# Patient Record
Sex: Male | Born: 1945 | Race: White | Hispanic: No | Marital: Married | Smoking: Never smoker
Health system: Southern US, Community
[De-identification: ages and names within clinical notes are randomized; demographics above are authoritative.]

## PROBLEM LIST (undated history)

## (undated) DIAGNOSIS — N320 Bladder-neck obstruction: Secondary | ICD-10-CM

## (undated) DIAGNOSIS — I10 Essential (primary) hypertension: Secondary | ICD-10-CM

## (undated) DIAGNOSIS — R350 Frequency of micturition: Secondary | ICD-10-CM

## (undated) DIAGNOSIS — N433 Hydrocele, unspecified: Secondary | ICD-10-CM

## (undated) DIAGNOSIS — E785 Hyperlipidemia, unspecified: Secondary | ICD-10-CM

## (undated) DIAGNOSIS — N529 Male erectile dysfunction, unspecified: Secondary | ICD-10-CM

## (undated) HISTORY — PX: TONSILLECTOMY AND ADENOIDECTOMY: SUR1326

## (undated) HISTORY — DX: Frequency of micturition: R35.0

## (undated) HISTORY — DX: Essential (primary) hypertension: I10

## (undated) HISTORY — DX: Hydrocele, unspecified: N43.3

## (undated) HISTORY — PX: VASECTOMY: SHX75

## (undated) HISTORY — DX: Hyperlipidemia, unspecified: E78.5

## (undated) HISTORY — DX: Male erectile dysfunction, unspecified: N52.9

## (undated) HISTORY — DX: Bladder-neck obstruction: N32.0

---

## 2013-02-06 ENCOUNTER — Ambulatory Visit: Payer: Self-pay | Admitting: Unknown Physician Specialty

## 2013-02-10 LAB — PATHOLOGY REPORT

## 2014-12-07 DIAGNOSIS — E782 Mixed hyperlipidemia: Secondary | ICD-10-CM | POA: Insufficient documentation

## 2015-04-22 ENCOUNTER — Encounter: Payer: Self-pay | Admitting: Urology

## 2015-05-12 ENCOUNTER — Ambulatory Visit (INDEPENDENT_AMBULATORY_CARE_PROVIDER_SITE_OTHER): Payer: Medicare HMO | Admitting: Urology

## 2015-05-12 ENCOUNTER — Encounter: Payer: Self-pay | Admitting: Urology

## 2015-05-12 VITALS — BP 138/75 | HR 71 | Ht 71.0 in | Wt 196.0 lb

## 2015-05-12 DIAGNOSIS — R3915 Urgency of urination: Secondary | ICD-10-CM | POA: Diagnosis not present

## 2015-05-12 DIAGNOSIS — N401 Enlarged prostate with lower urinary tract symptoms: Secondary | ICD-10-CM

## 2015-05-12 LAB — MICROSCOPIC EXAMINATION: Bacteria, UA: NONE SEEN

## 2015-05-12 LAB — BLADDER SCAN AMB NON-IMAGING: SCAN RESULT: 0

## 2015-05-12 LAB — PR COMPLEX UROFLOWMETRY: SCAN RESULT: 310

## 2015-05-12 LAB — URINALYSIS, COMPLETE
Bilirubin, UA: NEGATIVE
Glucose, UA: NEGATIVE
Ketones, UA: NEGATIVE
LEUKOCYTES UA: NEGATIVE
NITRITE UA: NEGATIVE
PH UA: 7 (ref 5.0–7.5)
Protein, UA: NEGATIVE
SPEC GRAV UA: 1.01 (ref 1.005–1.030)
Urobilinogen, Ur: 0.2 mg/dL (ref 0.2–1.0)

## 2015-05-12 NOTE — Progress Notes (Signed)
05/12/2015 4:12 PM   Trevor Romero. Apr 30, 1946 [id number]  Referring provider: No referring provider defined for this encounter.  Chief Complaint  Patient presents with  . Benign Prostatic Hypertrophy    follow up    HPI: Trevor Romero is a 69 year old white male with a history of BPH with L UTS who is currently on finasteride and still having bothersome symptoms. He complains of frequent urination, urgency and nocturia. The urgency is the most bothersome symptom to the patient. He had been on alpha blocker in addition to the finasteride in the past without relief of his urinary urgency. Uroflow/ bladder ultrasound is performed today to see if the urgency is due to an obstructive process.  Peak Flow (Qmax):   18.0 Ml/s  Average Flow (Qavg):   9.4 Ml/s  Voided Volume (Vvoid):  232.0 Ml  Voiding Time (Ttotal):   28.2 s  Flow Time (Tflow):   24.6 s  Time to Peak Flow (TQmax):  8.0 s   Flow Comments/Assessment : Patient with a non obstuctive pattern.       IPSS      05/12/15 0900       International Prostate Symptom Score   How often have you had the sensation of not emptying your bladder? Less than 1 in 5     How often have you had to urinate less than every two hours? Less than half the time     How often have you found you stopped and started again several times when you urinated? Less than half the time     How often have you found it difficult to postpone urination? Less than half the time     How often have you had a weak urinary stream? Less than 1 in 5 times     How often have you had to strain to start urination? Not at All     How many times did you typically get up at night to urinate? 2 Times     Total IPSS Score 10     Quality of Life due to urinary symptoms   If you were to spend the rest of your life with your urinary condition just the way it is now how would you feel about that? Mostly Disatisfied        Score:  1-7 Mild 8-19 Moderate 20-35  Severe   PMH: Past Medical History  Diagnosis Date  . Hydrocele     left  . HLD (hyperlipidemia)   . Bladder neck obstruction   . BPH (benign prostatic hyperplasia)   . Urinary frequency   . Hypertension   . ED (erectile dysfunction)     Surgical History: Past Surgical History  Procedure Laterality Date  . Vasectomy    . Tonsillectomy and adenoidectomy      Home Medications:    Medication List       This list is accurate as of: 05/12/15 11:59 PM.  Always use your most recent med list.               finasteride 5 MG tablet  Commonly known as:  PROSCAR  Take 5 mg by mouth daily.     multivitamin tablet  Take 1 tablet by mouth daily.        Allergies: No Known Allergies  Family History: Family History  Problem Relation Age of Onset  . Cancer Neg Hx     Kidney,Prostate, Bladder  . COPD Father  Social History:  reports that he has never smoked. He does not have any smokeless tobacco history on file. He reports that he drinks about 1.2 oz of alcohol per week. He reports that he does not use illicit drugs.  ROS: Urological Symptom Review  Patient is experiencing the following symptoms: Frequent urination Hard to postpone urination Get up at night to urinate Erection problems (male only)   Review of Systems  Gastrointestinal (upper)  : Negative for upper GI symptoms  Gastrointestinal (lower) : Negative for lower GI symptoms  Constitutional : Negative for symptoms  Skin: Negative for skin symptoms  Eyes: Negative for eye symptoms  Ear/Nose/Throat : Negative for Ear/Nose/Throat symptoms  Hematologic/Lymphatic: Negative for Hematologic/Lymphatic symptoms  Cardiovascular : Negative for cardiovascular symptoms  Respiratory : Negative for respiratory symptoms  Endocrine: Negative for endocrine symptoms  Musculoskeletal: Negative for musculoskeletal symptoms  Neurological: Negative for neurological  symptoms  Psychologic: Negative for psychiatric symptoms   Physical Exam: Blood pressure 138/75, pulse 71, height 5\' 11"  (1.803 m), weight 196 lb (88.905 kg).   Laboratory Data: Results for orders placed or performed in visit on 05/12/15  Microscopic Examination  Result Value Ref Range   WBC, UA 0-5 0 -  5 /hpf   RBC, UA 0-2 0 -  2 /hpf   Epithelial Cells (non renal) 0-10 0 - 10 /hpf   Bacteria, UA None seen None seen/Few  PSA  Result Value Ref Range   Prostate Specific Ag, Serum 0.4 0.0 - 4.0 ng/mL  Urinalysis, Complete  Result Value Ref Range   Specific Gravity, UA 1.010 1.005 - 1.030   pH, UA 7.0 5.0 - 7.5   Color, UA Yellow Yellow   Appearance Ur Clear Clear   Leukocytes, UA Negative Negative   Protein, UA Negative Negative/Trace   Glucose, UA Negative Negative   Ketones, UA Negative Negative   RBC, UA Trace (A) Negative   Bilirubin, UA Negative Negative   Urobilinogen, Ur 0.2 0.2 - 1.0 mg/dL   Nitrite, UA Negative Negative   Microscopic Examination See below:   PR COMPLEX UROFLOWMETRY  Result Value Ref Range   Scan Result 310   BLADDER SCAN AMB NON-IMAGING  Result Value Ref Range   Scan Result 0    No results found for: WBC, HGB, HCT, MCV, PLT  No results found for: CREATININE  Lab Results  Component Value Date   PSA 0.4 05/12/2015    No results found for: TESTOSTERONE  No results found for: HGBA1C  Urinalysis    Component Value Date/Time   GLUCOSEU Negative 05/12/2015 0955   BILIRUBINUR Negative 05/12/2015 0955   NITRITE Negative 05/12/2015 0955   LEUKOCYTESUR Negative 05/12/2015 0955    Pertinent Imaging:  Assessment & Plan:    1. Benign prostatic hypertrophy with lower urinary tract symptoms (LUTS):  Uroflow did not demonstrate an obstructive pattern. He does have an enlarged prostate on exam, so we will continue finasteride at this time.  - BLADDER SCAN AMB NON-IMAGING - PR COMPLEX UROFLOWMETRY - PSA  PSA History:    0.9 ng/mL on  10/21/2012    1.2 ng/mL on 10/21/2013    0.9 ng/mL on 10/21/2014    0.4 ng/mL on 04/20/2015    0.4 ng/mL on 05/12/2015  2. Urgency:   Patient is given Myrbetriq 25 mg samples #28 tablets.  I have advised the patient of the side effects of Myrbetriq, such as: elevation in BP, urinary retention and/or HA.  He will return in 3  weeks  for repeat uroflow/bladder ultrasound.   No Follow-up on file.  Zara Council, [facility] [address] [place], [place] [address] [phone]

## 2015-05-13 LAB — PSA: Prostate Specific Ag, Serum: 0.4 ng/mL (ref 0.0–4.0)

## 2015-05-16 DIAGNOSIS — N401 Enlarged prostate with lower urinary tract symptoms: Secondary | ICD-10-CM | POA: Insufficient documentation

## 2015-05-16 DIAGNOSIS — R3915 Urgency of urination: Secondary | ICD-10-CM | POA: Insufficient documentation

## 2015-06-07 ENCOUNTER — Encounter: Payer: Self-pay | Admitting: Urology

## 2015-06-07 ENCOUNTER — Ambulatory Visit (INDEPENDENT_AMBULATORY_CARE_PROVIDER_SITE_OTHER): Payer: Medicare HMO | Admitting: Urology

## 2015-06-07 VITALS — BP 121/78 | HR 68 | Resp 18 | Ht 71.0 in | Wt 195.2 lb

## 2015-06-07 DIAGNOSIS — N401 Enlarged prostate with lower urinary tract symptoms: Secondary | ICD-10-CM

## 2015-06-07 LAB — BLADDER SCAN AMB NON-IMAGING

## 2015-06-07 MED ORDER — MIRABEGRON ER 25 MG PO TB24: 25.0000 mg | ORAL_TABLET | Freq: Every day | ORAL | Status: DC

## 2015-06-07 NOTE — Progress Notes (Signed)
06/07/2015 3:39 PM   Trevor Holts Jr. 12/04/1945 [id number]  Referring provider: No referring provider defined for this encounter.  Chief Complaint  Patient presents with  . Follow-up    3 wk - Uroflow / PVR  . Benign Prostatic Hypertrophy    HPI: Trevor Romero is a 69 year old white male with BPH with L UTS whose most bothersome symptom was urinary urgency. He was started on Myrbetriq 25 mg daily 3 weeks ago and he presents today for a PVR and uroflow and symptom recheck.  Uroflow was not completed today due to patient given a specimen cup and emptying his bladder prior to the test.  His PVR today was 80 mL. He states he seen major improvement in his urinary urgency. His blood pressure has not risen.  His BPH is still controlled with finasteride 5 mg daily.  He has not had any hematuria, dysuria or suprapubic pain. He also denies any recent fevers, chills, nausea or vomiting.  PMH: Past Medical History  Diagnosis Date  . Hydrocele     left  . HLD (hyperlipidemia)   . Bladder neck obstruction   . Urinary frequency   . Hypertension   . ED (erectile dysfunction)     Surgical History: Past Surgical History  Procedure Laterality Date  . Vasectomy    . Tonsillectomy and adenoidectomy      Home Medications:    Medication List       This list is accurate as of: 06/07/15  3:39 PM.  Always use your most recent med list.               finasteride 5 MG tablet  Commonly known as:  PROSCAR  Take 5 mg by mouth daily.     mirabegron ER 25 MG Tb24 tablet  Commonly known as:  MYRBETRIQ  Take 1 tablet (25 mg total) by mouth daily.     multivitamin tablet  Take 1 tablet by mouth daily.        Allergies: No Known Allergies  Family History: Family History  Problem Relation Age of Onset  . Cancer Neg Hx     Kidney,Prostate, Bladder  . COPD Father     Social History:  reports that he has never smoked. He does not have any smokeless tobacco history on file.  He reports that he drinks about 1.2 oz of alcohol per week. He reports that he does not use illicit drugs.  ROS: Urological Symptom Review  Patient is experiencing the following symptoms: Frequent urination Hard to postpone urination Get up at night to urinate Erection problems (male only)   Review of Systems  Gastrointestinal (upper) : Negative for upper GI symptoms  Gastrointestinal (lower) : Negative for lower GI symptoms  Constitutional : Negative for symptoms  Skin: Negative for skin symptoms  Eyes: Negative for eye symptoms  Ear/Nose/Throat : Negative for Ear/Nose/Throat symptoms  Hematologic/Lymphatic: Negative for Hematologic/Lymphatic symptoms  Cardiovascular : Negative for cardiovascular symptoms  Respiratory : Negative for respiratory symptoms  Endocrine: Negative for endocrine symptoms  Musculoskeletal: Negative for musculoskeletal symptoms  Neurological: Negative for neurological symptoms  Psychologic: Negative for psychiatric symptoms  Physical Exam: BP 121/78 mmHg  Pulse 68  Resp 18  Ht 5\' 11"  (1.803 m)  Wt 195 lb 3.2 oz (88.542 kg)  BMI 27.24 kg/m2   Laboratory Data: Results for orders placed or performed in visit on 06/07/15  BLADDER SCAN AMB NON-IMAGING  Result Value Ref Range   Scan Result 31ml  No results found for: WBC, HGB, HCT, MCV, PLT  No results found for: CREATININE  Lab Results  Component Value Date   PSA 0.4 05/12/2015    No results found for: TESTOSTERONE  No results found for: HGBA1C  Urinalysis    Component Value Date/Time   GLUCOSEU Negative 05/12/2015 0955   BILIRUBINUR Negative 05/12/2015 0955   NITRITE Negative 05/12/2015 0955   LEUKOCYTESUR Negative 05/12/2015 0955    Pertinent Imaging:  Assessment & Plan:    1. Benign prostatic hypertrophy with lower urinary tract symptoms (LUTS):    Patient's BPH is controlled with finasteride. He will return in 6 months time for IPSS score, PVR, DRE  and PSA.  PSA History:  0.9 ng/mL on 10/21/2012  1.2 ng/mL on 10/21/2013  0.9 ng/mL on 10/21/2014  0.4 ng/mL on 04/20/2015  0.4 ng/mL on 05/12/2015  - BLADDER SCAN AMB NON-IMAGING  2. Urgency:   Patient urgency is well controlled on Myrbetriq 25 mg  Daily.  He has not had any untoward side effects with the medication. His PVR is 80 mL sent a prescription for him to his pharmacy. He will follow up in 6 months time for PVR and symptom recheck.   Return in about 6 months (around 12/08/2015) for IPSS, PVR, DRE, PSA.  Zara Council, [facility] [address], [place], [place] [address] [phone]

## 2015-12-03 DIAGNOSIS — Z125 Encounter for screening for malignant neoplasm of prostate: Secondary | ICD-10-CM | POA: Diagnosis not present

## 2015-12-03 DIAGNOSIS — E7800 Pure hypercholesterolemia, unspecified: Secondary | ICD-10-CM | POA: Diagnosis not present

## 2015-12-03 DIAGNOSIS — Z79899 Other long term (current) drug therapy: Secondary | ICD-10-CM | POA: Diagnosis not present

## 2015-12-07 ENCOUNTER — Encounter: Payer: Self-pay | Admitting: Urology

## 2015-12-07 ENCOUNTER — Ambulatory Visit (INDEPENDENT_AMBULATORY_CARE_PROVIDER_SITE_OTHER): Payer: Medicare HMO | Admitting: Urology

## 2015-12-07 VITALS — BP 130/75 | HR 73 | Resp 16 | Ht 71.0 in | Wt 195.1 lb

## 2015-12-07 DIAGNOSIS — N401 Enlarged prostate with lower urinary tract symptoms: Secondary | ICD-10-CM

## 2015-12-07 DIAGNOSIS — N400 Enlarged prostate without lower urinary tract symptoms: Secondary | ICD-10-CM | POA: Diagnosis not present

## 2015-12-07 DIAGNOSIS — N138 Other obstructive and reflux uropathy: Secondary | ICD-10-CM

## 2015-12-07 DIAGNOSIS — R3915 Urgency of urination: Secondary | ICD-10-CM

## 2015-12-07 DIAGNOSIS — N433 Hydrocele, unspecified: Secondary | ICD-10-CM

## 2015-12-07 LAB — BLADDER SCAN AMB NON-IMAGING

## 2015-12-07 NOTE — Progress Notes (Signed)
10:29 PM   Trevor Romero. 1946/03/28 [id number]  Referring provider: Derinda Late, MD [phone] [facility] [place], [place] [address]  Chief Complaint  Patient presents with  . Benign Prostatic Hypertrophy    HPI: Patient is a 70 year old Caucasian male with urinary urgency and BPH with LUTS who presents today for a 6 month follow-up.  Urinary urgency Patient's urgency is controlled with Myrbetriq 25 mg daily.    BPH WITH LUTS His IPSS score today is 3, which is mild lower urinary tract symptomatology. He is pleased with his quality life due to his urinary symptoms. His PVR is 51 mL.  He denies any dysuria, hematuria or suprapubic pain.   He currently taking finasteride 5 mg daily and Myrbetriq 25 mg daily.   He also denies any recent fevers, chills, nausea or vomiting.  He does not have a family history of PCa.      IPSS      12/07/15 1400       International Prostate Symptom Score   How often have you had the sensation of not emptying your bladder? Not at All     How often have you had to urinate less than every two hours? Less than 1 in 5 times     How often have you found you stopped and started again several times when you urinated? Not at All     How often have you found it difficult to postpone urination? Not at All     How often have you had a weak urinary stream? Not at All     How often have you had to strain to start urination? Not at All     How many times did you typically get up at night to urinate? 2 Times     Total IPSS Score 3     Quality of Life due to urinary symptoms   If you were to spend the rest of your life with your urinary condition just the way it is now how would you feel about that? Pleased        Score:  1-7 Mild 8-19 Moderate 20-35 Severe  PMH: Past Medical History  Diagnosis Date  . Hydrocele     left  . HLD (hyperlipidemia)   . Bladder neck obstruction   . Urinary frequency     . Hypertension   . ED (erectile dysfunction)     Surgical History: Past Surgical History  Procedure Laterality Date  . Vasectomy    . Tonsillectomy and adenoidectomy      Home Medications:    Medication List       This list is accurate as of: 12/07/15 11:59 PM.  Always use your most recent med list.               finasteride 5 MG tablet  Commonly known as:  PROSCAR  Take 5 mg by mouth daily.     Fish Oil 1000 MG Caps  Take by mouth.     mirabegron ER 25 MG Tb24 tablet  Commonly known as:  MYRBETRIQ  Take 1 tablet (25 mg total) by mouth daily.     multivitamin tablet  Take 1 tablet by mouth daily.     PROBIOTIC ADVANCED PO  Take by mouth.        Allergies: No Known Allergies  Family History: Family History  Problem Relation Age of Onset  . Cancer Neg Hx  Kidney,Prostate, Bladder  . COPD Father     Social History:  reports that he has never smoked. He does not have any smokeless tobacco history on file. He reports that he drinks about 1.2 oz of alcohol per week. He reports that he does not use illicit drugs.  ROS: Urological Symptom Review  Patient is experiencing the following symptoms: Frequent urination Hard to postpone urination Get up at night to urinate Erection problems (male only)   Review of Systems  Gastrointestinal (upper) : Negative for upper GI symptoms  Gastrointestinal (lower) : Negative for lower GI symptoms  Constitutional : Negative for symptoms  Skin: Negative for skin symptoms  Eyes: Negative for eye symptoms  Ear/Nose/Throat : Negative for Ear/Nose/Throat symptoms  Hematologic/Lymphatic: Negative for Hematologic/Lymphatic symptoms  Cardiovascular : Negative for cardiovascular symptoms  Respiratory : Negative for respiratory symptoms  Endocrine: Negative for endocrine symptoms  Musculoskeletal: Negative for musculoskeletal symptoms  Neurological: Negative for neurological  symptoms  Psychologic: Negative for psychiatric symptoms  Physical Exam: BP 130/75 mmHg  Pulse 73  Resp 16  Ht 5\' 11"  (1.803 m)  Wt 195 lb 1.6 oz (88.497 kg)  BMI 27.22 kg/m2  GU: No CVA tenderness.  No bladder fullness or masses.  Patient with circumcised phallus.  Urethral meatus is patent.  No penile discharge. No penile lesions or rashes. Scrotum without lesions, cysts, rashes and/or edema.  Right testicle is located scrotally.  Left hydrocele is noted.  No masses are appreciated in the testicles. Left and right epididymis are normal. Rectal: Patient with  normal sphincter tone. Anus and perineum without scarring or rashes. No rectal masses are appreciated. Prostate is approximately 35 grams, no nodules are appreciated. Seminal vesicles are normal.   Laboratory Data: PSA History:  0.9 ng/mL on 10/21/2012  1.2 ng/mL on 10/21/2013  0.9 ng/mL on 10/21/2014  0.4 ng/mL on 04/20/2015  0.4 ng/mL on 05/12/2015   Pertinent Imaging: Results for BASSEM, BOUMAN (MRN [id number]) as of 12/07/2015 14:52  Ref. Range 12/07/2015 14:47  Scan Result Unknown 51 mL   Assessment & Plan:    1. Benign prostatic hypertrophy with lower urinary tract symptoms (LUTS):    Patient's BPH is controlled with finasteride.  He will continue the medication.  He will return in 12 months time for IPSS score, exam and PSA.  - BLADDER SCAN AMB NON-IMAGING  2. Urgency:   Patient urgency is well controlled on Myrbetriq 25 mg daily.  He has not had any untoward side effects with the medication. His PVR is 51 mL.  He will continue the medication.   He will follow up in 12 months time for PVR and symptom recheck.  3. Left hydrocele:   I could not palpate the left testicle on today's exam and the patient has not had a scrotal ultrasound recently.  I will schedule a scrotal ultrasound to examine the internal structures of the scrotum.  I will contact him by phone to discuss the results.     Return in  about 1 year (around 12/06/2016) for IPSS score and exam.  Zara Council, [facility] [address], [place], [place] [address] [phone]

## 2015-12-08 LAB — PSA: PROSTATE SPECIFIC AG, SERUM: 0.4 ng/mL (ref 0.0–4.0)

## 2015-12-09 ENCOUNTER — Telehealth: Payer: Self-pay

## 2015-12-09 NOTE — Telephone Encounter (Signed)
Spoke with pt in reference to PSA results. Pt voiced understanding.  

## 2015-12-09 NOTE — Telephone Encounter (Signed)
-----   Message from Nori Riis, PA-C sent at 12/08/2015  8:26 AM EST ----- Patient's PSA is stable.  We will see him in one year.

## 2015-12-10 DIAGNOSIS — E7800 Pure hypercholesterolemia, unspecified: Secondary | ICD-10-CM | POA: Diagnosis not present

## 2015-12-10 DIAGNOSIS — Z0000 Encounter for general adult medical examination without abnormal findings: Secondary | ICD-10-CM | POA: Diagnosis not present

## 2015-12-10 DIAGNOSIS — Z125 Encounter for screening for malignant neoplasm of prostate: Secondary | ICD-10-CM | POA: Diagnosis not present

## 2015-12-10 DIAGNOSIS — Z79899 Other long term (current) drug therapy: Secondary | ICD-10-CM | POA: Diagnosis not present

## 2015-12-12 DIAGNOSIS — N401 Enlarged prostate with lower urinary tract symptoms: Principal | ICD-10-CM

## 2015-12-12 DIAGNOSIS — N138 Other obstructive and reflux uropathy: Secondary | ICD-10-CM | POA: Insufficient documentation

## 2015-12-12 DIAGNOSIS — N433 Hydrocele, unspecified: Secondary | ICD-10-CM | POA: Insufficient documentation

## 2015-12-20 ENCOUNTER — Ambulatory Visit
Admission: RE | Admit: 2015-12-20 | Discharge: 2015-12-20 | Disposition: A | Discharge status: Home / Self Care | Payer: Medicare HMO | Source: Ambulatory Visit | Attending: Urology | Admitting: Urology

## 2015-12-20 DIAGNOSIS — N433 Hydrocele, unspecified: Secondary | ICD-10-CM | POA: Diagnosis not present

## 2015-12-21 ENCOUNTER — Telehealth: Payer: Self-pay

## 2015-12-21 NOTE — Telephone Encounter (Signed)
-----   Message from Nori Riis, PA-C sent at 12/20/2015  1:28 PM EST ----- Please let the patient know that the ultrasound demonstrates a hydrocele.  We will see him in one year.

## 2015-12-21 NOTE — Telephone Encounter (Signed)
Spoke with pt in reference to hydrocele. Pt voiced understanding. Made aware we will see him in 1 year.

## 2016-01-04 DIAGNOSIS — Z23 Encounter for immunization: Secondary | ICD-10-CM | POA: Diagnosis not present

## 2016-01-21 ENCOUNTER — Telehealth: Payer: Self-pay

## 2016-01-21 NOTE — Telephone Encounter (Signed)
Received fax from   [facility], [address]. Patient needs refill Finasteride 5 Mg tablets

## 2016-01-24 ENCOUNTER — Telehealth: Payer: Self-pay

## 2016-01-24 ENCOUNTER — Encounter: Payer: Self-pay | Admitting: Urology

## 2016-01-24 NOTE — Telephone Encounter (Signed)
I have completed my finastride prescription. Since my prostate has shrunk to normal, per my most recent visit, do I have to get it refilled? Thanks   Sent via [employer]

## 2016-01-30 NOTE — Telephone Encounter (Signed)
Patient may have refills for his finatsteride 5 mg daily, #90 with one years refills.

## 2016-02-17 ENCOUNTER — Other Ambulatory Visit: Payer: Self-pay

## 2016-02-17 NOTE — Telephone Encounter (Signed)
Received fax from [facility] to refill Finasteride 5 mg

## 2016-02-18 NOTE — Telephone Encounter (Signed)
This is not our pt canceled rx request

## 2016-02-18 NOTE — Telephone Encounter (Signed)
This is not an [facility] Patient. Do not refill medication.

## 2016-02-21 ENCOUNTER — Telehealth: Payer: Self-pay | Admitting: Urology

## 2016-02-21 DIAGNOSIS — N400 Enlarged prostate without lower urinary tract symptoms: Secondary | ICD-10-CM

## 2016-02-21 NOTE — Telephone Encounter (Signed)
Trevor Romero called saying he was told by [facility] that they sent a medication refill request to the office but received no response so they're unable to refill his Finasteride. He's wondering when this will be taken care of. Please call the patient if possible.   Pt's ph# [phone] Thank you.

## 2016-02-22 MED ORDER — FINASTERIDE 5 MG PO TABS: 5.0000 mg | ORAL_TABLET | Freq: Every day | ORAL | Status: DC

## 2016-02-22 NOTE — Telephone Encounter (Signed)
Spoke with pt and made aware medication has been sent to pharmacy. Pt voiced understanding.

## 2016-06-22 ENCOUNTER — Other Ambulatory Visit: Payer: Self-pay

## 2016-06-22 DIAGNOSIS — N400 Enlarged prostate without lower urinary tract symptoms: Secondary | ICD-10-CM

## 2016-06-22 MED ORDER — FINASTERIDE 5 MG PO TABS: 5.0000 mg | ORAL_TABLET | Freq: Every day | ORAL | 3 refills | Status: DC

## 2016-07-06 ENCOUNTER — Telehealth: Payer: Self-pay | Admitting: Urology

## 2016-07-06 DIAGNOSIS — N3281 Overactive bladder: Secondary | ICD-10-CM

## 2016-07-06 MED ORDER — MIRABEGRON ER 25 MG PO TB24
25.0000 mg | ORAL_TABLET | Freq: Every day | ORAL | 3 refills | Status: DC
Start: 2016-07-06 — End: 2018-11-29

## 2016-07-06 NOTE — Telephone Encounter (Signed)
LMOM

## 2016-08-08 DIAGNOSIS — R69 Illness, unspecified: Secondary | ICD-10-CM | POA: Diagnosis not present

## 2016-08-23 DIAGNOSIS — Z23 Encounter for immunization: Secondary | ICD-10-CM | POA: Diagnosis not present

## 2016-10-19 ENCOUNTER — Other Ambulatory Visit: Payer: Self-pay

## 2016-10-19 DIAGNOSIS — N401 Enlarged prostate with lower urinary tract symptoms: Secondary | ICD-10-CM

## 2016-10-19 MED ORDER — FINASTERIDE 5 MG PO TABS: 5.0000 mg | ORAL_TABLET | Freq: Every day | ORAL | 3 refills | Status: DC

## 2016-11-01 DIAGNOSIS — Z0000 Encounter for general adult medical examination without abnormal findings: Secondary | ICD-10-CM | POA: Diagnosis not present

## 2016-11-01 DIAGNOSIS — N3281 Overactive bladder: Secondary | ICD-10-CM | POA: Diagnosis not present

## 2016-11-30 ENCOUNTER — Other Ambulatory Visit: Payer: Self-pay

## 2016-11-30 ENCOUNTER — Other Ambulatory Visit: Payer: Medicare HMO

## 2016-11-30 DIAGNOSIS — N401 Enlarged prostate with lower urinary tract symptoms: Secondary | ICD-10-CM | POA: Diagnosis not present

## 2016-12-01 LAB — PSA: Prostate Specific Ag, Serum: 0.4 ng/mL (ref 0.0–4.0)

## 2016-12-07 ENCOUNTER — Ambulatory Visit: Payer: Medicare HMO | Admitting: Urology

## 2016-12-11 DIAGNOSIS — Z125 Encounter for screening for malignant neoplasm of prostate: Secondary | ICD-10-CM | POA: Diagnosis not present

## 2016-12-11 DIAGNOSIS — E7800 Pure hypercholesterolemia, unspecified: Secondary | ICD-10-CM | POA: Diagnosis not present

## 2016-12-11 DIAGNOSIS — Z79899 Other long term (current) drug therapy: Secondary | ICD-10-CM | POA: Diagnosis not present

## 2016-12-21 DIAGNOSIS — Z0000 Encounter for general adult medical examination without abnormal findings: Secondary | ICD-10-CM | POA: Diagnosis not present

## 2016-12-29 ENCOUNTER — Ambulatory Visit (INDEPENDENT_AMBULATORY_CARE_PROVIDER_SITE_OTHER): Payer: Medicare HMO | Admitting: Urology

## 2016-12-29 ENCOUNTER — Encounter: Payer: Self-pay | Admitting: Urology

## 2016-12-29 VITALS — BP 154/71 | HR 76 | Ht 71.0 in | Wt 195.0 lb

## 2016-12-29 DIAGNOSIS — N138 Other obstructive and reflux uropathy: Secondary | ICD-10-CM | POA: Diagnosis not present

## 2016-12-29 DIAGNOSIS — N3941 Urge incontinence: Secondary | ICD-10-CM

## 2016-12-29 DIAGNOSIS — N433 Hydrocele, unspecified: Secondary | ICD-10-CM | POA: Diagnosis not present

## 2016-12-29 DIAGNOSIS — N401 Enlarged prostate with lower urinary tract symptoms: Secondary | ICD-10-CM

## 2016-12-29 MED ORDER — FINASTERIDE 5 MG PO TABS: 5.0000 mg | ORAL_TABLET | Freq: Every day | ORAL | 3 refills | Status: DC

## 2016-12-29 NOTE — Progress Notes (Signed)
10:59 AM   Tawny Asal. 1946-01-09 [id number]  Referring provider: Derinda Late, MD [phone] [facility] [place], [place] [address]  Chief Complaint  Patient presents with  . Benign Prostatic Hypertrophy    1year    HPI: Patient is a 71 year old Caucasian male with urinary urgency, hydrocele and BPH with LUTS who presents today for a 6 month follow-up.  Urinary urgency Patient's urgency is controlled with Myrbetriq 25 mg daily.    Left hydrocele Not bothersome to patient.  Scrotal ultrasound noted large complex left hydrocele thickened septations, possibly from prior infection.  Small right hydrocele. The exam is otherwise normal. No testicular mass. No evidence of torsion. In 2015.    BPH WITH LUTS His IPSS score today is 4, which is mild lower urinary tract symptomatology.  He is mostly satisfied with his quality life due to his urinary symptoms.  His previous I PSS 3/1.  His previous PVR was 51 mL.  He denies any dysuria, hematuria or suprapubic pain.   He currently taking finasteride 5 mg daily and Myrbetriq 25 mg daily.   He also denies any recent fevers, chills, nausea or vomiting.  He does not have a family history of PCa.      IPSS    Row Name 12/29/16 1000         International Prostate Symptom Score   How often have you had the sensation of not emptying your bladder? Not at All     How often have you had to urinate less than every two hours? Less than 1 in 5 times     How often have you found you stopped and started again several times when you urinated? Not at All     How often have you found it difficult to postpone urination? Less than 1 in 5 times     How often have you had a weak urinary stream? Not at All     How often have you had to strain to start urination? Not at All     How many times did you typically get up at night to urinate? 2 Times     Total IPSS Score 4       Quality of Life due to urinary  symptoms   If you were to spend the rest of your life with your urinary condition just the way it is now how would you feel about that? Mostly Satisfied        Score:  1-7 Mild 8-19 Moderate 20-35 Severe  PMH: Past Medical History:  Diagnosis Date  . Bladder neck obstruction   . ED (erectile dysfunction)   . HLD (hyperlipidemia)   . Hydrocele    left  . Hypertension   . Urinary frequency     Surgical History: Past Surgical History:  Procedure Laterality Date  . TONSILLECTOMY AND ADENOIDECTOMY    . VASECTOMY      Home Medications:  Allergies as of 12/29/2016   No Known Allergies     Medication List       Accurate as of 12/29/16 10:59 AM. Always use your most recent med list.          finasteride 5 MG tablet Commonly known as:  PROSCAR Take 1 tablet (5 mg total) by mouth daily.   Fish Oil 1000 MG Caps Take by mouth.   mirabegron ER 25 MG Tb24 tablet Commonly known as:  MYRBETRIQ Take 1  tablet (25 mg total) by mouth daily.   multivitamin tablet Take 1 tablet by mouth daily.   PROBIOTIC ADVANCED PO Take by mouth.       Allergies: No Known Allergies  Family History: Family History  Problem Relation Age of Onset  . Cancer Neg Hx     Kidney,Prostate, Bladder  . COPD Father     Social History:  reports that he has never smoked. He has never used smokeless tobacco. He reports that he drinks about 1.2 oz of alcohol per week . He reports that he does not use drugs.  ROS: UROLOGY Frequent Urination?: No Hard to postpone urination?: No Burning/pain with urination?: No Get up at night to urinate?: No Leakage of urine?: Yes Urine stream starts and stops?: No Trouble starting stream?: No Do you have to strain to urinate?: No Blood in urine?: No Urinary tract infection?: No Sexually transmitted disease?: No Injury to kidneys or bladder?: No Painful intercourse?: No Weak stream?: No Erection problems?: No Penile pain?:  No Gastrointestinal Nausea?: No Vomiting?: No Indigestion/heartburn?: No Diarrhea?: No Constipation?: No Constitutional Fever: No Night sweats?: No Weight loss?: No Fatigue?: No Skin Skin rash/lesions?: No Itching?: No Eyes Blurred vision?: No Double vision?: No Ears/Nose/Throat Sore throat?: No Sinus problems?: No Hematologic/Lymphatic Swollen glands?: No Easy bruising?: No Cardiovascular Leg swelling?: No Chest pain?: No Respiratory Cough?: No Shortness of breath?: No Endocrine Excessive thirst?: No Musculoskeletal Back pain?: No Joint pain?: No Neurological Headaches?: No Dizziness?: No Psychologic Depression?: No Anxiety?: No     Physical Exam: BP (!) 154/71   Pulse 76   Ht 5\' 11"  (1.803 m)   Wt 195 lb (88.5 kg)   BMI 27.20 kg/m   GU: No CVA tenderness.  No bladder fullness or masses.  Patient with circumcised phallus.  Urethral meatus is patent.  No penile discharge. No penile lesions or rashes. Scrotum without lesions, cysts, rashes and/or edema.  Right testicle is located scrotally.  Left hydrocele is noted.  No masses are appreciated in the testicles. Left and right epididymis are normal. Rectal: Patient with  normal sphincter tone. Anus and perineum without scarring or rashes. No rectal masses are appreciated. Prostate is approximately 35 grams, no nodules are appreciated. Seminal vesicles are normal.   Laboratory Data: PSA History:  0.9 ng/mL on 10/21/2012  1.2 ng/mL on 10/21/2013  0.9 ng/mL on 10/21/2014  0.4 ng/mL on 04/20/2015  0.4 ng/mL on 05/12/2015    0.4 ng/mL on 11/30/2016   Pertinent Imaging: CLINICAL DATA:  Hydrocele.  EXAM: SCROTAL ULTRASOUND  DOPPLER ULTRASOUND OF THE TESTICLES  TECHNIQUE: Complete ultrasound examination of the testicles, epididymis, and other scrotal structures was performed. Color and spectral Doppler ultrasound were also utilized to evaluate blood flow to the testicles.  COMPARISON:   No prior.  FINDINGS: Right testicle  Measurements: 4.2 x 2.7 x 3.1 cm. No mass or microlithiasis visualized.  Left testicle  Measurements: 3.8 x 2.8 x 2.7 cm. No mass or microlithiasis visualized.  Right epididymis:  Normal in size and appearance.  Left epididymis:  Normal in size and appearance.  Hydrocele: Large septated left hydrocele. Septation may be from prior infection. Small right hydrocele.  Varicocele:  None visualized.  Pulsed Doppler interrogation of both testes demonstrates normal low resistance arterial and venous waveforms bilaterally.  IMPRESSION: 1. Large complex left hydrocele thickened septations, possibly from prior infection. 2. Small right hydrocele. The exam is otherwise normal. No testicular mass. No evidence of torsion.   Electronically Signed  By: [place]   On: 12/20/2015 08:42   Assessment & Plan:    1. BPH with LUTS  - IPSS score is 4/2, it is worsening  - Continue conservative management, avoiding bladder irritants and timed voiding's  - Continue finasteride 5 mg daily; refills given  - Cannot tolerate medication or medication failure, schedule cystoscopy  - RTC in 12 months for IPSS, PSA and exam   2. Urgency:   Patient urgency is well controlled on Myrbetriq 25 mg daily.  He has not had any untoward side effects with the medication. He will continue the medication. Refills given.   He will follow up in 12 months time for PVR and symptom recheck.  3. Left hydrocele:   Not bothersome at this time.  Continue to monitor.   Return in about 1 year (around 12/29/2017) for IPSS, PSA and exam.  Zara Council, [facility]  [place] [address], [place], [place] [address] [phone]

## 2017-03-08 DIAGNOSIS — R69 Illness, unspecified: Secondary | ICD-10-CM | POA: Diagnosis not present

## 2017-04-12 DIAGNOSIS — H2513 Age-related nuclear cataract, bilateral: Secondary | ICD-10-CM | POA: Diagnosis not present

## 2017-04-12 DIAGNOSIS — H40013 Open angle with borderline findings, low risk, bilateral: Secondary | ICD-10-CM | POA: Diagnosis not present

## 2017-04-14 DIAGNOSIS — R69 Illness, unspecified: Secondary | ICD-10-CM | POA: Diagnosis not present

## 2017-07-23 IMAGING — US US SCROTUM
1 series · 14 of 25 positions shown · non-contrast
Comparison: No prior.

CLINICAL DATA: Hydrocele.

EXAM:
SCROTAL ULTRASOUND
DOPPLER ULTRASOUND OF THE TESTICLES
TECHNIQUE: Complete ultrasound examination of the testicles, epididymis, and
other scrotal structures was performed. Color and spectral Doppler
ultrasound were also utilized to evaluate blood flow to the
testicles.

[Series 1: us scrotum · 0.09mm/px · 68 acquisitions, 14 frames shown]
[im 1/68]
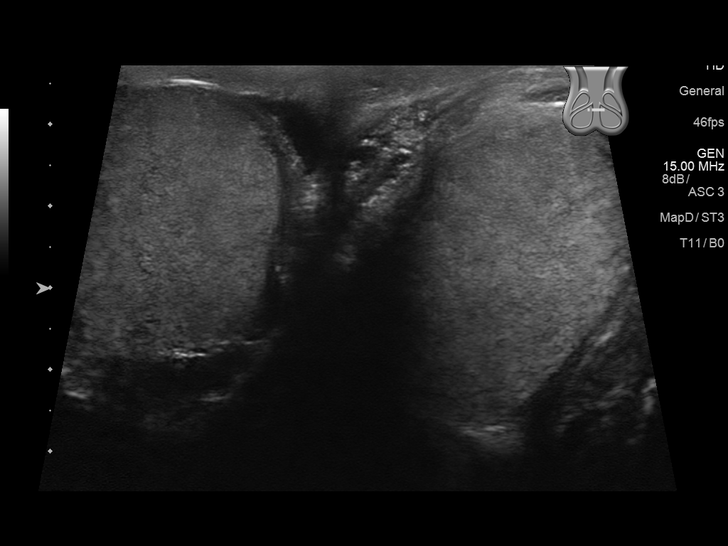
[im 6/68]
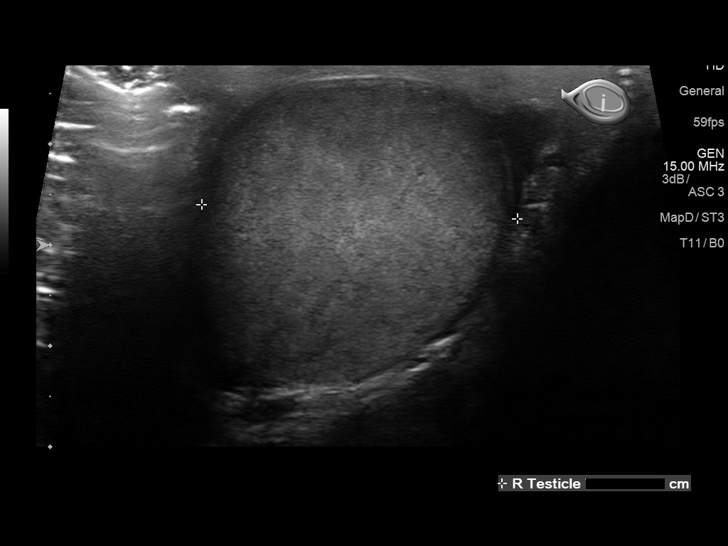
[im 12/68]
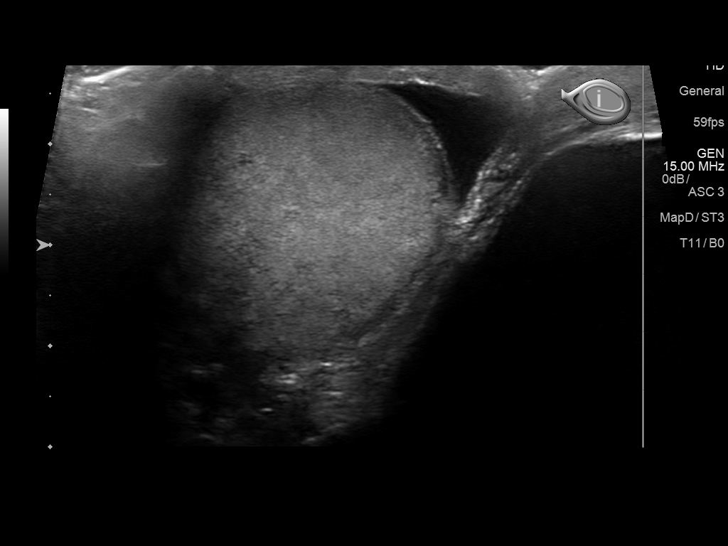
[im 17/68]
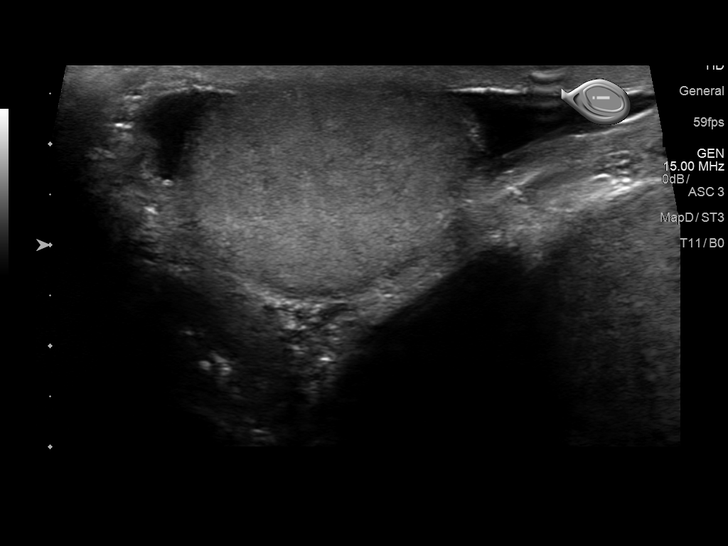
[im 23/68]
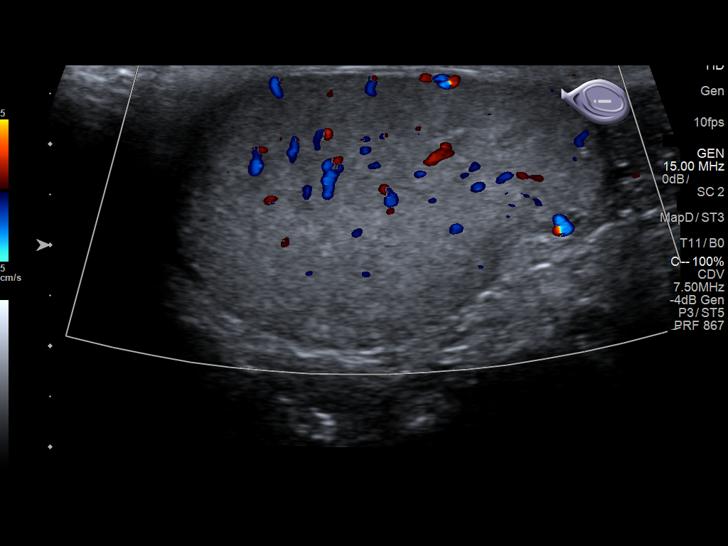
[im 26/68]
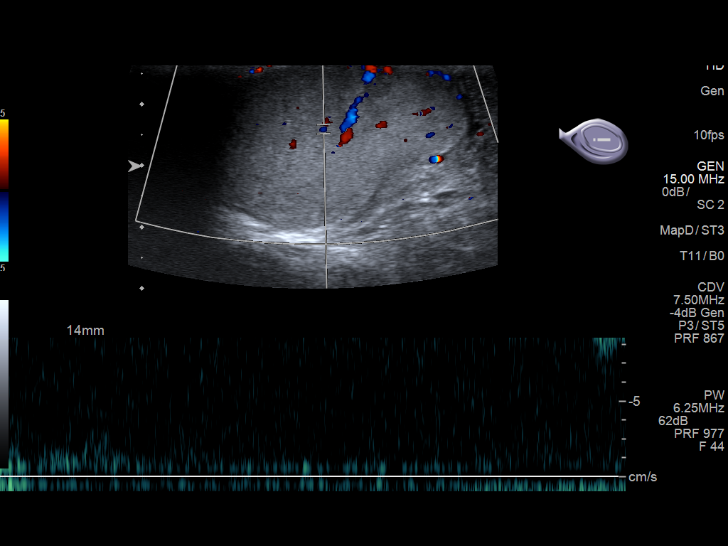
[im 31/68]
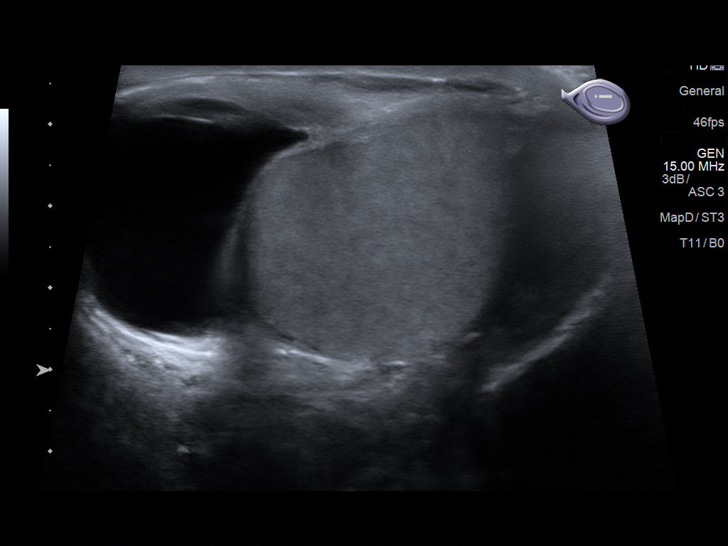
[im 37/68]
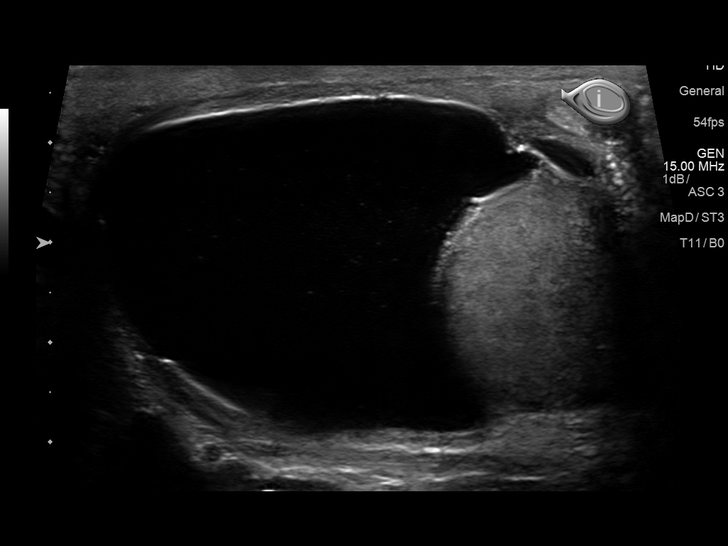
[im 42/68]
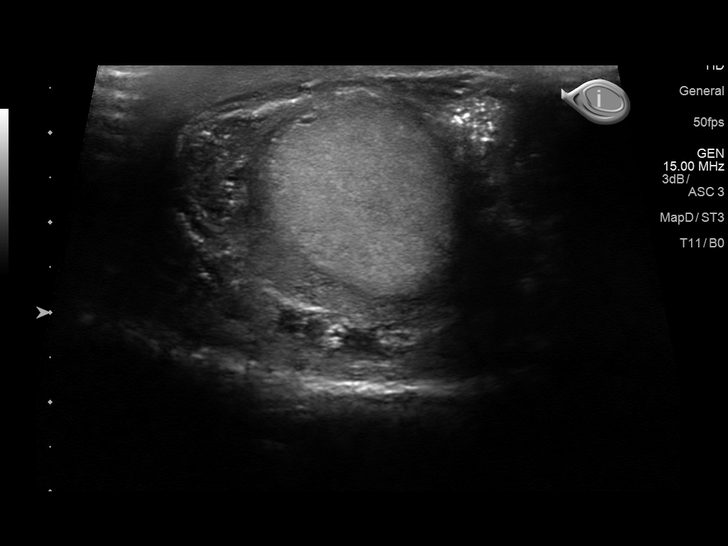
[im 45/68]
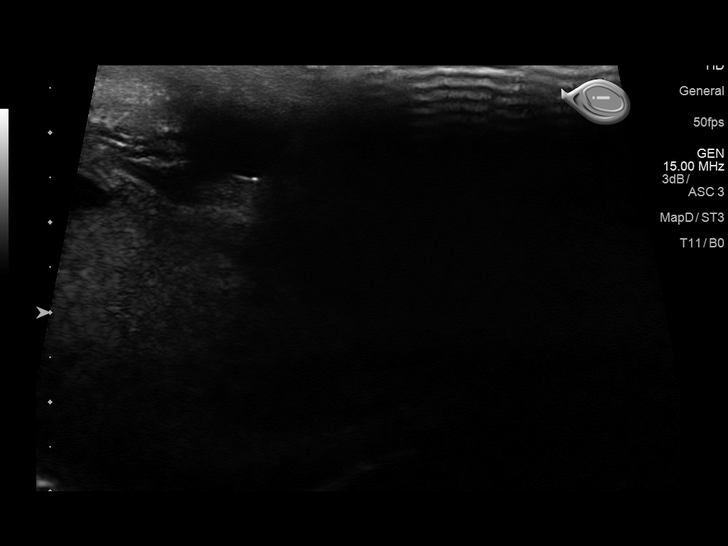
[im 51/68]
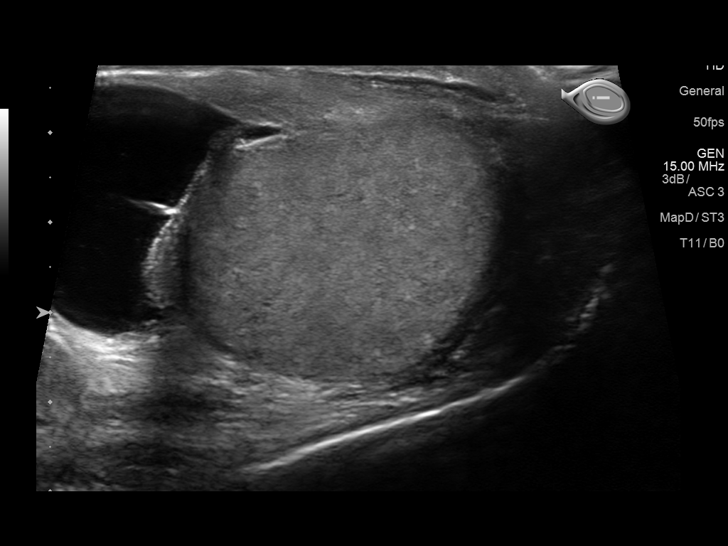
[im 56/68]
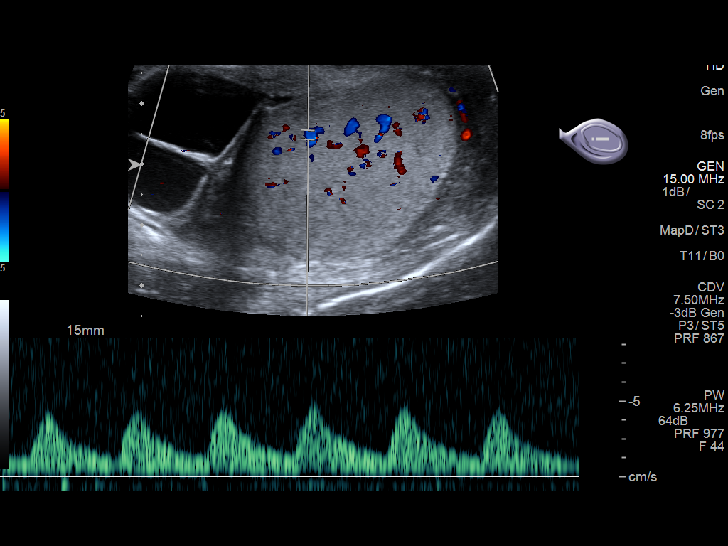
[im 62/68]
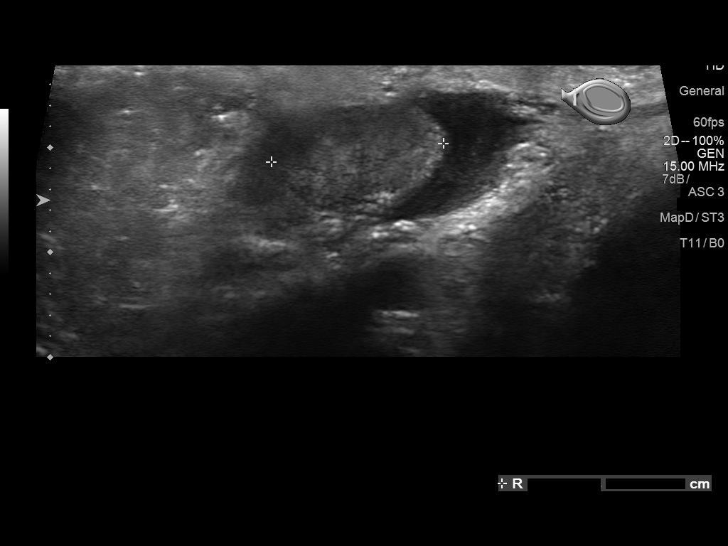
[im 68/68]
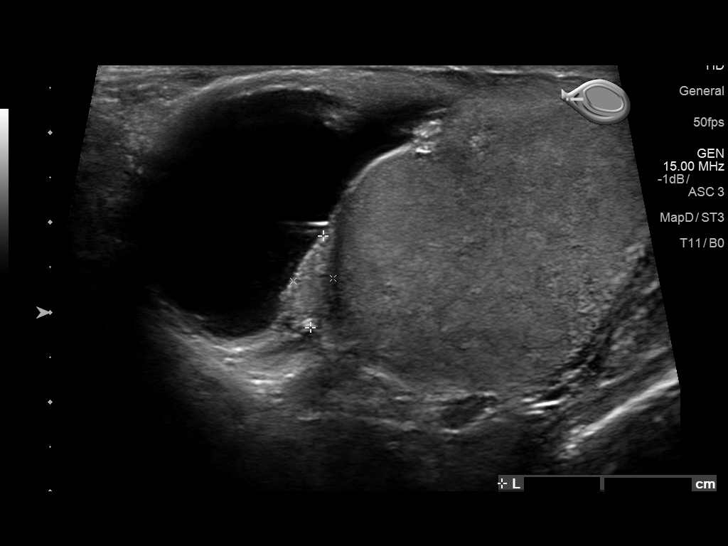

[14 of 25 positions shown; findings below may reference images not displayed]

FINDINGS: Right testicle

Measurements: 4.2 x 2.7 x 3.1 cm. No mass or microlithiasis
visualized.

Left testicle

Measurements: 3.8 x 2.8 x 2.7 cm. No mass or microlithiasis
visualized.

Right epididymis:  Normal in size and appearance.

Left epididymis:  Normal in size and appearance.

Hydrocele: Large septated left hydrocele. Septation may be from
prior infection. Small right hydrocele.

Varicocele:  None visualized.

Pulsed Doppler interrogation of both testes demonstrates normal low
resistance arterial and venous waveforms bilaterally.
IMPRESSION: 1. Large complex left hydrocele thickened septations, possibly from
prior infection.
2. Small right hydrocele. The exam is otherwise normal. No
testicular mass. No evidence of torsion.

## 2017-09-05 DIAGNOSIS — Z23 Encounter for immunization: Secondary | ICD-10-CM | POA: Diagnosis not present

## 2017-09-10 DIAGNOSIS — R69 Illness, unspecified: Secondary | ICD-10-CM | POA: Diagnosis not present

## 2017-12-17 DIAGNOSIS — Z13220 Encounter for screening for lipoid disorders: Secondary | ICD-10-CM | POA: Diagnosis not present

## 2017-12-17 DIAGNOSIS — Z79899 Other long term (current) drug therapy: Secondary | ICD-10-CM | POA: Diagnosis not present

## 2017-12-17 DIAGNOSIS — Z125 Encounter for screening for malignant neoplasm of prostate: Secondary | ICD-10-CM | POA: Diagnosis not present

## 2017-12-21 DIAGNOSIS — N400 Enlarged prostate without lower urinary tract symptoms: Secondary | ICD-10-CM | POA: Diagnosis not present

## 2017-12-21 DIAGNOSIS — Z825 Family history of asthma and other chronic lower respiratory diseases: Secondary | ICD-10-CM | POA: Diagnosis not present

## 2017-12-28 ENCOUNTER — Ambulatory Visit: Payer: Medicare HMO | Admitting: Urology

## 2017-12-31 DIAGNOSIS — Z0000 Encounter for general adult medical examination without abnormal findings: Secondary | ICD-10-CM | POA: Diagnosis not present

## 2017-12-31 DIAGNOSIS — Z125 Encounter for screening for malignant neoplasm of prostate: Secondary | ICD-10-CM | POA: Diagnosis not present

## 2017-12-31 DIAGNOSIS — E7800 Pure hypercholesterolemia, unspecified: Secondary | ICD-10-CM | POA: Diagnosis not present

## 2018-01-04 ENCOUNTER — Ambulatory Visit: Payer: Medicare HMO | Admitting: Urology

## 2018-03-05 DIAGNOSIS — Z8371 Family history of colonic polyps: Secondary | ICD-10-CM | POA: Diagnosis not present

## 2018-03-05 DIAGNOSIS — Z1211 Encounter for screening for malignant neoplasm of colon: Secondary | ICD-10-CM | POA: Diagnosis not present

## 2018-04-01 DIAGNOSIS — D2272 Melanocytic nevi of left lower limb, including hip: Secondary | ICD-10-CM | POA: Diagnosis not present

## 2018-04-01 DIAGNOSIS — X32XXXA Exposure to sunlight, initial encounter: Secondary | ICD-10-CM | POA: Diagnosis not present

## 2018-04-01 DIAGNOSIS — D485 Neoplasm of uncertain behavior of skin: Secondary | ICD-10-CM | POA: Diagnosis not present

## 2018-04-01 DIAGNOSIS — D225 Melanocytic nevi of trunk: Secondary | ICD-10-CM | POA: Diagnosis not present

## 2018-04-01 DIAGNOSIS — D2261 Melanocytic nevi of right upper limb, including shoulder: Secondary | ICD-10-CM | POA: Diagnosis not present

## 2018-04-01 DIAGNOSIS — L570 Actinic keratosis: Secondary | ICD-10-CM | POA: Diagnosis not present

## 2018-04-01 DIAGNOSIS — D2271 Melanocytic nevi of right lower limb, including hip: Secondary | ICD-10-CM | POA: Diagnosis not present

## 2018-04-01 DIAGNOSIS — D0421 Carcinoma in situ of skin of right ear and external auricular canal: Secondary | ICD-10-CM | POA: Diagnosis not present

## 2018-04-01 DIAGNOSIS — D0472 Carcinoma in situ of skin of left lower limb, including hip: Secondary | ICD-10-CM | POA: Diagnosis not present

## 2018-04-01 DIAGNOSIS — D2262 Melanocytic nevi of left upper limb, including shoulder: Secondary | ICD-10-CM | POA: Diagnosis not present

## 2018-04-18 DIAGNOSIS — K5730 Diverticulosis of large intestine without perforation or abscess without bleeding: Secondary | ICD-10-CM | POA: Diagnosis not present

## 2018-04-18 DIAGNOSIS — Z1211 Encounter for screening for malignant neoplasm of colon: Secondary | ICD-10-CM | POA: Diagnosis not present

## 2018-04-18 DIAGNOSIS — D122 Benign neoplasm of ascending colon: Secondary | ICD-10-CM | POA: Diagnosis not present

## 2018-04-18 DIAGNOSIS — K640 First degree hemorrhoids: Secondary | ICD-10-CM | POA: Diagnosis not present

## 2018-04-18 DIAGNOSIS — Z8371 Family history of colonic polyps: Secondary | ICD-10-CM | POA: Diagnosis not present

## 2018-04-23 DIAGNOSIS — R69 Illness, unspecified: Secondary | ICD-10-CM | POA: Diagnosis not present

## 2018-04-25 DIAGNOSIS — H2513 Age-related nuclear cataract, bilateral: Secondary | ICD-10-CM | POA: Diagnosis not present

## 2018-04-25 DIAGNOSIS — H40013 Open angle with borderline findings, low risk, bilateral: Secondary | ICD-10-CM | POA: Diagnosis not present

## 2018-05-17 DIAGNOSIS — D0421 Carcinoma in situ of skin of right ear and external auricular canal: Secondary | ICD-10-CM | POA: Diagnosis not present

## 2018-09-03 DIAGNOSIS — R69 Illness, unspecified: Secondary | ICD-10-CM | POA: Diagnosis not present

## 2018-10-09 DIAGNOSIS — Z08 Encounter for follow-up examination after completed treatment for malignant neoplasm: Secondary | ICD-10-CM | POA: Diagnosis not present

## 2018-10-09 DIAGNOSIS — L570 Actinic keratosis: Secondary | ICD-10-CM | POA: Diagnosis not present

## 2018-10-09 DIAGNOSIS — X32XXXA Exposure to sunlight, initial encounter: Secondary | ICD-10-CM | POA: Diagnosis not present

## 2018-10-09 DIAGNOSIS — D492 Neoplasm of unspecified behavior of bone, soft tissue, and skin: Secondary | ICD-10-CM | POA: Diagnosis not present

## 2018-10-09 DIAGNOSIS — D23112 Other benign neoplasm of skin of right lower eyelid, including canthus: Secondary | ICD-10-CM | POA: Diagnosis not present

## 2018-10-09 DIAGNOSIS — L821 Other seborrheic keratosis: Secondary | ICD-10-CM | POA: Diagnosis not present

## 2018-10-09 DIAGNOSIS — D23122 Other benign neoplasm of skin of left lower eyelid, including canthus: Secondary | ICD-10-CM | POA: Diagnosis not present

## 2018-10-09 DIAGNOSIS — Z85828 Personal history of other malignant neoplasm of skin: Secondary | ICD-10-CM | POA: Diagnosis not present

## 2018-11-27 DIAGNOSIS — R69 Illness, unspecified: Secondary | ICD-10-CM | POA: Diagnosis not present

## 2018-11-29 ENCOUNTER — Encounter: Payer: Self-pay | Admitting: Family Medicine

## 2018-11-29 ENCOUNTER — Ambulatory Visit (INDEPENDENT_AMBULATORY_CARE_PROVIDER_SITE_OTHER): Payer: Medicare HMO | Admitting: Family Medicine

## 2018-11-29 VITALS — BP 150/82 | HR 61 | Temp 97.7°F | Ht 71.0 in | Wt 198.4 lb

## 2018-11-29 DIAGNOSIS — E7800 Pure hypercholesterolemia, unspecified: Secondary | ICD-10-CM

## 2018-11-29 DIAGNOSIS — Z131 Encounter for screening for diabetes mellitus: Secondary | ICD-10-CM | POA: Diagnosis not present

## 2018-11-29 DIAGNOSIS — R030 Elevated blood-pressure reading, without diagnosis of hypertension: Secondary | ICD-10-CM | POA: Diagnosis not present

## 2018-11-29 DIAGNOSIS — Z1159 Encounter for screening for other viral diseases: Secondary | ICD-10-CM

## 2018-11-29 DIAGNOSIS — Z0000 Encounter for general adult medical examination without abnormal findings: Secondary | ICD-10-CM

## 2018-11-29 DIAGNOSIS — C4492 Squamous cell carcinoma of skin, unspecified: Secondary | ICD-10-CM | POA: Insufficient documentation

## 2018-11-29 DIAGNOSIS — N401 Enlarged prostate with lower urinary tract symptoms: Secondary | ICD-10-CM | POA: Diagnosis not present

## 2018-11-29 LAB — COMPREHENSIVE METABOLIC PANEL
ALT: 13 U/L (ref 0–53)
AST: 15 U/L (ref 0–37)
Albumin: 4 g/dL (ref 3.5–5.2)
Alkaline Phosphatase: 62 U/L (ref 39–117)
BILIRUBIN TOTAL: 0.4 mg/dL (ref 0.2–1.2)
BUN: 18 mg/dL (ref 6–23)
CALCIUM: 9.3 mg/dL (ref 8.4–10.5)
CO2: 29 meq/L (ref 19–32)
Chloride: 103 mEq/L (ref 96–112)
Creatinine, Ser: 1.06 mg/dL (ref 0.40–1.50)
GFR: 72.6 mL/min (ref >60.00)
Glucose, Bld: 83 mg/dL (ref 70–99)
POTASSIUM: 4 meq/L (ref 3.5–5.1)
Sodium: 138 mEq/L (ref 135–145)
Total Protein: 6.4 g/dL (ref 6.0–8.3)

## 2018-11-29 LAB — LIPID PANEL
Cholesterol: 196 mg/dL (ref 0–200)
HDL: 43.9 mg/dL (ref >39.00)
NonHDL: 152.05
TRIGLYCERIDES: 328 mg/dL — AB (ref 0.0–149.0)
Total CHOL/HDL Ratio: 4
VLDL: 65.6 mg/dL — ABNORMAL HIGH (ref 0.0–40.0)

## 2018-11-29 LAB — PSA: PSA: 0.31 ng/mL (ref 0.10–4.00)

## 2018-11-29 LAB — LDL CHOLESTEROL, DIRECT: Direct LDL: 123 mg/dL

## 2018-11-29 NOTE — Progress Notes (Deleted)
Patient: Trevor Romero. MRN: [id number] DOB: August 28, 1946 PCP: Orma Flaming, MD     Subjective:  Chief Complaint  Patient presents with  . Establish Care    HPI: The patient is a 73 y.o. male who presents today for establishing care.  Review of Systems  Constitutional: Negative for fatigue.  Eyes: Negative for visual disturbance.  Respiratory: Negative for shortness of breath.   Cardiovascular: Negative for chest pain.  Gastrointestinal: Negative for abdominal pain, constipation, diarrhea and nausea.  Genitourinary: Negative.   Musculoskeletal: Negative for back pain and neck pain.  Skin: Negative.   Neurological: Negative for dizziness and headaches.  Psychiatric/Behavioral: Negative for dysphoric mood and sleep disturbance. The patient is not nervous/anxious.     Allergies Patient has No Known Allergies.  Past Medical History Patient  has a past medical history of Bladder neck obstruction, ED (erectile dysfunction), HLD (hyperlipidemia), Hydrocele, Hypertension, and Urinary frequency.  Surgical History Patient  has a past surgical history that includes Vasectomy and Tonsillectomy and adenoidectomy.  Family History Pateint's family history includes COPD in his father.  Social History Patient  reports that he has never smoked. He has never used smokeless tobacco. He reports current alcohol use of about 2.0 standard drinks of alcohol per week. He reports that he does not use drugs.    Objective: Vitals:   11/29/18 0925  BP: (!) 152/92  Pulse: 61  Temp: 97.7 F (36.5 C)  TempSrc: Oral  SpO2: 98%  Weight: 198 lb 6.4 oz (90 kg)  Height: 5\' 11"  (1.803 m)    Body mass index is 27.67 kg/m.  Physical Exam     Depression screen PHQ 2/9 11/29/2018  Decreased Interest 0  Down, Depressed, Hopeless 0  PHQ - 2 Score 0    Assessment/plan:      No follow-ups on file.     @[facility] @ 11/29/2018

## 2018-11-29 NOTE — Progress Notes (Signed)
Phone: [phone]  Subjective:  Patient presents today for their  Medicare Exam. He has a history of hyperlipidemia and BPH. He is currently on finesteride. He used to be on a statin, but was taken off medication.   Preventive Screening-Counseling & Management   Advanced directives: yes   Smoking Status: Never Smoker Second Hand Smoking status: No smokers in home  Risk Factors Regular exercise: yes, he goes to the [facility].  Diet: good. Well balanced.   Fall Risk: None  No flowsheet data found. Opioid use history:  no long term opioids use  Cardiac risk factors:  advanced age (older than 57 for men, 33 for women) yes Hyperlipidemia yes No diabetes. no Family History: none    Depression Screen None. PHQ2 0  Depression screen PHQ 2/9 11/29/2018  Decreased Interest 0  Down, Depressed, Hopeless 0  PHQ - 2 Score 0    Activities of Daily Living Independent ADLs and IADLs   Hearing Difficulties: -patient declines  Cognitive Testing Mini-cog: 4/5 No reported trouble.    Normal 3 word recall  List the Names of Other Physician/Practitioners you currently use: -Dr. Sallyanne Havers- opthalmology. Followed yearly  -Dr. Dasher-dermatology in [place]. Hx of SCC   Immunization History  Administered Date(s) Administered  . Influenza-Unspecified 08/29/2012, 09/03/2013, 08/21/2014, 08/25/2015, 08/23/2016, 09/05/2017  . Pneumococcal Conjugate-13 01/04/2016  . Pneumococcal Polysaccharide-23 11/17/2013  . Zoster 12/07/2013   Required Immunizations needed today none  Screening tests- up to date There are no preventive care reminders to display for this patient.  Review of Systems  Constitutional: Negative for chills, fever and malaise/fatigue.  HENT: Negative for hearing loss and sore throat.   Eyes: Negative for blurred vision and double vision.  Respiratory: Negative for cough, shortness of breath and wheezing.   Cardiovascular: Negative for chest pain, palpitations  and leg swelling.  Gastrointestinal: Negative for abdominal pain, blood in stool, nausea and vomiting.  Genitourinary: Negative for dysuria and hematuria.  Musculoskeletal: Negative for falls.  Skin: Negative for rash.  Neurological: Negative for dizziness and weakness.  Psychiatric/Behavioral: Negative for memory loss and suicidal ideas. The patient is not nervous/anxious and does not have insomnia.      The following were reviewed and entered/updated in epic: Past Medical History:  Diagnosis Date  . Bladder neck obstruction   . ED (erectile dysfunction)   . HLD (hyperlipidemia)   . Hydrocele    left  . Hypertension   . Urinary frequency    Patient Active Problem List   Diagnosis Date Noted  . 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 BPH with obstruction/lower urinary tract symptoms 12/12/2015  . Hydrocele sac 12/12/2015  . Benign prostatic hyperplasia with lower urinary tract symptoms 05/16/2015  . Urgency of urination 05/16/2015  . 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 TONSILLECTOMY AND ADENOIDECTOMY    . VASECTOMY      Family History  Problem Relation Age of Onset  . Cancer Neg Hx        Kidney,Prostate, Bladder  . COPD Father     Medications- reviewed and updated Current Outpatient Medications  Medication Sig Dispense Refill  . finasteride (PROSCAR) 5 MG tablet Take 1 tablet (5 mg total) by mouth daily. 90 tablet 3  . Multiple Vitamin (MULTIVITAMIN) tablet Take 1 tablet by mouth daily.    . Omega-3 Fatty Acids (FISH OIL) 1000 MG CAPS Take by mouth.    . Probiotic Product (PROBIOTIC ADVANCED PO) Take by mouth.  No current facility-administered medications for this visit.     Allergies-reviewed and updated No Known Allergies  Social History   Socioeconomic History  . Marital status: Married    Spouse name: Not on file  . Number of children: Not on file  . Years of  education: Not on file  . Highest education level: Not on file  Occupational History  . Not on file  Social Needs  . Financial resource strain: Not on file  . Food insecurity:    Worry: Not on file    Inability: Not on file  . Transportation needs:    Medical: Not on file    Non-medical: Not on file  Tobacco Use  . Smoking status: Never Smoker  . Smokeless tobacco: Never Used  Substance and Sexual Activity  . Alcohol use: Yes    Alcohol/week: 2.0 standard drinks    Types: 2 Glasses of wine per week    Comment: occasionally   . Drug use: No  . Sexual activity: Not on file  Lifestyle  . Physical activity:    Days per week: Not on file    Minutes per session: Not on file  . Stress: Not on file  Relationships  . Social connections:    Talks on phone: Not on file    Gets together: Not on file    Attends religious service: Not on file    Active member of club or organization: Not on file    Attends meetings of clubs or organizations: Not on file    Relationship status: Not on file  Other Topics Concern  . Not on file  Social History Narrative  . Not on file    Objective: BP (!) 150/82 (BP Location: Left Arm, Patient Position: Sitting)   Pulse 61   Temp 97.7 F (36.5 C) (Oral)   Ht 5\' 11"  (1.803 m)   Wt 198 lb 6.4 oz (90 kg)   SpO2 98%   BMI 27.67 kg/m  Gen: NAD, resting comfortably HEENT: Mucous membranes are moist. Oropharynx normal Neck: no thyromegaly CV: RRR no murmurs rubs or gallops Lungs: CTAB no crackles, wheeze, rhonchi Abdomen: soft/nontender/nondistended/normal bowel sounds. No rebound or guarding.  Ext: no edema Skin: warm, dry Neuro: grossly normal, moves all extremities, PERRLA  Assessment/Plan:  Medicare exam completed- discussed recommended screenings anddocumented any personalized health advice and referrals for preventive counseling. See AVS as well which was given to patient.   Status of chronic or acute concerns  BPH: checking PSA  today. Well controlled on finesteride. No refills needed today.  Hyperlipidemia: checking today.  Elevated BP: repeat a little above goal. He will keep a log and come back in 3 months time for recheck with log.   No future appointments. Return in about 3 months (around 02/28/2019) for blood pressure check up .   Lab/Order associations: Screening for diabetes mellitus - Plan: Comprehensive metabolic panel  Medicare annual wellness visit, subsequent  Hypercholesterolemia without hypertriglyceridemia - Plan: Lipid panel  Encounter for hepatitis C screening test for low risk patient - Plan: Hepatitis C antibody  Benign prostatic hyperplasia with lower urinary tract symptoms, symptom details unspecified - Plan: PSA  Elevated blood-pressure reading without diagnosis of hypertension  No orders of the defined types were placed in this encounter.   Return precautions advised. Orma Flaming, MD

## 2018-11-30 LAB — HEPATITIS C ANTIBODY
Hepatitis C Ab: NONREACTIVE
SIGNAL TO CUT-OFF: 0.02 (ref <1.00)

## 2018-12-02 ENCOUNTER — Encounter: Payer: Self-pay | Admitting: Family Medicine

## 2019-03-03 ENCOUNTER — Ambulatory Visit: Payer: Medicare HMO | Admitting: Family Medicine

## 2019-03-05 ENCOUNTER — Encounter: Payer: Self-pay | Admitting: Family Medicine

## 2019-03-05 ENCOUNTER — Ambulatory Visit (INDEPENDENT_AMBULATORY_CARE_PROVIDER_SITE_OTHER): Payer: Medicare HMO | Admitting: Family Medicine

## 2019-03-05 ENCOUNTER — Other Ambulatory Visit: Payer: Self-pay

## 2019-03-05 VITALS — BP 148/84 | HR 51 | Temp 97.5°F | Ht 71.0 in | Wt 191.4 lb

## 2019-03-05 DIAGNOSIS — R030 Elevated blood-pressure reading, without diagnosis of hypertension: Secondary | ICD-10-CM | POA: Diagnosis not present

## 2019-03-05 DIAGNOSIS — E782 Mixed hyperlipidemia: Secondary | ICD-10-CM

## 2019-03-05 NOTE — Patient Instructions (Addendum)
Buy your own arm cuff and start keeping a log. I would check it about 3x/week after sitting for 5 minutes. Will see you back in September with log and will recheck fasting cholesterol then as well.   Hypertension Hypertension, commonly called high blood pressure, is when the force of blood pumping through the arteries is too strong. The arteries are the blood vessels that carry blood from the heart throughout the body. Hypertension forces the heart to work harder to pump blood and may cause arteries to become narrow or stiff. Having untreated or uncontrolled hypertension can cause heart attacks, strokes, kidney disease, and other problems. A blood pressure reading consists of a higher number over a lower number. Ideally, your blood pressure should be below 120/80. The first ("top") number is called the systolic pressure. It is a measure of the pressure in your arteries as your heart beats. The second ("bottom") number is called the diastolic pressure. It is a measure of the pressure in your arteries as the heart relaxes. What are the causes? The cause of this condition is not known. What increases the risk? Some risk factors for high blood pressure are under your control. Others are not. Factors you can change  Smoking.  Having type 2 diabetes mellitus, high cholesterol, or both.  Not getting enough exercise or physical activity.  Being overweight.  Having too much fat, sugar, calories, or salt (sodium) in your diet.  Drinking too much alcohol. Factors that are difficult or impossible to change  Having chronic kidney disease.  Having a family history of high blood pressure.  Age. Risk increases with age.  Race. You may be at higher risk if you are African-American.  Gender. Men are at higher risk than women before age 87. After age 75, women are at higher risk than men.  Having obstructive sleep apnea.  Stress. What are the signs or symptoms? Extremely high blood pressure  (hypertensive crisis) may cause:  Headache.  Anxiety.  Shortness of breath.  Nosebleed.  Nausea and vomiting.  Severe chest pain.  Jerky movements you cannot control (seizures). How is this diagnosed? This condition is diagnosed by measuring your blood pressure while you are seated, with your arm resting on a surface. The cuff of the blood pressure monitor will be placed directly against the skin of your upper arm at the level of your heart. It should be measured at least twice using the same arm. Certain conditions can cause a difference in blood pressure between your right and left arms. Certain factors can cause blood pressure readings to be lower or higher than normal (elevated) for a short period of time:  When your blood pressure is higher when you are in a health care provider's office than when you are at home, this is called white coat hypertension. Most people with this condition do not need medicines.  When your blood pressure is higher at home than when you are in a health care provider's office, this is called masked hypertension. Most people with this condition may need medicines to control blood pressure. If you have a high blood pressure reading during one visit or you have normal blood pressure with other risk factors:  You may be asked to return on a different day to have your blood pressure checked again.  You may be asked to monitor your blood pressure at home for 1 week or longer. If you are diagnosed with hypertension, you may have other blood or imaging tests to help your  health care provider understand your overall risk for other conditions. How is this treated? This condition is treated by making healthy lifestyle changes, such as eating healthy foods, exercising more, and reducing your alcohol intake. Your health care provider may prescribe medicine if lifestyle changes are not enough to get your blood pressure under control, and if:  Your systolic blood  pressure is above 130.  Your diastolic blood pressure is above 80. Your personal target blood pressure may vary depending on your medical conditions, your age, and other factors. Follow these instructions at home: Eating and drinking   Eat a diet that is high in fiber and potassium, and low in sodium, added sugar, and fat. An example eating plan is called the DASH (Dietary Approaches to Stop Hypertension) diet. To eat this way: ? Eat plenty of fresh fruits and vegetables. Try to fill half of your plate at each meal with fruits and vegetables. ? Eat whole grains, such as whole wheat pasta, brown rice, or whole grain bread. Fill about one quarter of your plate with whole grains. ? Eat or drink low-fat dairy products, such as skim milk or low-fat yogurt. ? Avoid fatty cuts of meat, processed or cured meats, and poultry with skin. Fill about one quarter of your plate with lean proteins, such as fish, chicken without skin, beans, eggs, and tofu. ? Avoid premade and processed foods. These tend to be higher in sodium, added sugar, and fat.  Reduce your daily sodium intake. Most people with hypertension should eat less than 1,500 mg of sodium a day.  Limit alcohol intake to no more than 1 drink a day for nonpregnant women and 2 drinks a day for men. One drink equals 12 oz of beer, 5 oz of wine, or 1 oz of hard liquor. Lifestyle   Work with your health care provider to maintain a healthy body weight or to lose weight. Ask what an ideal weight is for you.  Get at least 30 minutes of exercise that causes your heart to beat faster (aerobic exercise) most days of the week. Activities may include walking, swimming, or biking.  Include exercise to strengthen your muscles (resistance exercise), such as pilates or lifting weights, as part of your weekly exercise routine. Try to do these types of exercises for 30 minutes at least 3 days a week.  Do not use any products that contain nicotine or tobacco,  such as cigarettes and e-cigarettes. If you need help quitting, ask your health care provider.  Monitor your blood pressure at home as told by your health care provider.  Keep all follow-up visits as told by your health care provider. This is important. Medicines  Take over-the-counter and prescription medicines only as told by your health care provider. Follow directions carefully. Blood pressure medicines must be taken as prescribed.  Do not skip doses of blood pressure medicine. Doing this puts you at risk for problems and can make the medicine less effective.  Ask your health care provider about side effects or reactions to medicines that you should watch for. Contact a health care provider if:  You think you are having a reaction to a medicine you are taking.  You have headaches that keep coming back (recurring).  You feel dizzy.  You have swelling in your ankles.  You have trouble with your vision. Get help right away if:  You develop a severe headache or confusion.  You have unusual weakness or numbness.  You feel faint.  You have  severe pain in your chest or abdomen.  You vomit repeatedly.  You have trouble breathing. Summary  Hypertension is when the force of blood pumping through your arteries is too strong. If this condition is not controlled, it may put you at risk for serious complications.  Your personal target blood pressure may vary depending on your medical conditions, your age, and other factors. For most people, a normal blood pressure is less than 120/80.  Hypertension is treated with lifestyle changes, medicines, or a combination of both. Lifestyle changes include weight loss, eating a healthy, low-sodium diet, exercising more, and limiting alcohol. This information is not intended to replace advice given to you by your health care provider. Make sure you discuss any questions you have with your health care provider. Document Released: 11/06/2005  Document Revised: 10/04/2016 Document Reviewed: 10/04/2016 Elsevier Interactive Patient Education  2019 [employer].

## 2019-03-05 NOTE — Progress Notes (Signed)
Patient: Trevor Romero. MRN: [id number] DOB: 1946-07-05 PCP: Orma Flaming, MD     Subjective:  Chief Complaint  Patient presents with  . Hypertension    HPI: The patient is a 73 y.o. male who presents today for follow up of hypertension. I saw him about 3 months ago and he had elevated readings then. No hx of HTN and on no medication. He returns today for f/u. He has been keeping a log from the [facility] and states he usually runs 110/70 after a strenuous exercise. He has no h/a, chest pain, shortness of breath, leg swelling. He overall feels really good. He does not have a cuff at home.   Hyperlipidemia: ASCVD risk is 29%. He was not fasting when he did his labs and had a heavy breakfast. He was on a statin at one time, but his pervious doctor took him off about 5 years ago and he can not remember why this happened.   Review of Systems  Constitutional: Negative for fatigue and unexpected weight change.  Eyes: Negative for visual disturbance.  Respiratory: Negative for shortness of breath.   Cardiovascular: Negative for chest pain.  Gastrointestinal: Negative for abdominal pain and nausea.  Musculoskeletal: Negative for back pain and neck pain.  Neurological: Negative for dizziness and headaches.  Psychiatric/Behavioral: Negative for sleep disturbance.    Allergies Patient has No Known Allergies.  Past Medical History Patient  has a past medical history of Bladder neck obstruction, ED (erectile dysfunction), HLD (hyperlipidemia), Hydrocele, Hypertension, and Urinary frequency.  Surgical History Patient  has a past surgical history that includes Vasectomy and Tonsillectomy and adenoidectomy.  Family History Pateint's family history includes COPD in his father; Mental illness in his mother; Stroke in his maternal grandfather.  Social History Patient  reports that he has never smoked. He has never used smokeless tobacco. He reports current alcohol use of about 2.0 standard  drinks of alcohol per week. He reports that he does not use drugs.    Objective: Vitals:   03/05/19 0804 03/05/19 0836  BP: (!) 158/88 (!) 148/84  Pulse: (!) 51   Temp: (!) 97.5 F (36.4 C)   TempSrc: Oral   SpO2: 98%   Weight: 191 lb 6.4 oz (86.8 kg)   Height: 5\' 11"  (1.803 m)     Body mass index is 26.69 kg/m.  Physical Exam Vitals signs reviewed.  Constitutional:      Appearance: Normal appearance.  Neck:     Musculoskeletal: Normal range of motion and neck supple.  Cardiovascular:     Rate and Rhythm: Normal rate and regular rhythm.     Pulses: Normal pulses.     Heart sounds: Normal heart sounds.  Pulmonary:     Effort: Pulmonary effort is normal.     Breath sounds: Normal breath sounds.  Abdominal:     General: Abdomen is flat. Bowel sounds are normal.     Palpations: Abdomen is soft.  Skin:    Capillary Refill: Capillary refill takes less than 2 seconds.  Neurological:     General: No focal deficit present.     Mental Status: He is alert and oriented to person, place, and time.  Psychiatric:        Mood and Affect: Mood normal.        Behavior: Behavior normal.        Assessment/plan: 1. Elevated blood pressure reading I would really like to start medication; however, he is just not on board with this. We  are going to have him get his own cuff so he can bring to office and we can make sure calibrated correctly and cuff correct. Discussed I really doubt that his BP is that low after working out. DASH diet discussed. Will see him back when I return from maternity leave with log and make sure his cuff is correct. If continues to be above goal, will need to start him on medication.   2. Hyperlipidemia, mixed ASCVD risk is 29%. He was on statin a while ago, but it was stopped. He would like to repeat labs as he had a really heavy breakfast that morning of labs. Will recheck at f/u and calculate score again.      Return in about 5 months (around 08/05/2019) for  bp and fasting labs .   Orma Flaming, MD [place]   03/05/2019

## 2019-04-10 ENCOUNTER — Other Ambulatory Visit: Payer: Self-pay | Admitting: Family Medicine

## 2019-04-10 DIAGNOSIS — N3941 Urge incontinence: Secondary | ICD-10-CM

## 2019-04-10 MED ORDER — FINASTERIDE 5 MG PO TABS: 5.0000 mg | ORAL_TABLET | Freq: Every day | ORAL | 3 refills | Status: DC

## 2019-04-24 DIAGNOSIS — H524 Presbyopia: Secondary | ICD-10-CM | POA: Diagnosis not present

## 2019-04-24 DIAGNOSIS — H5203 Hypermetropia, bilateral: Secondary | ICD-10-CM | POA: Diagnosis not present

## 2019-04-24 DIAGNOSIS — H25813 Combined forms of age-related cataract, bilateral: Secondary | ICD-10-CM | POA: Diagnosis not present

## 2019-04-24 DIAGNOSIS — H02401 Unspecified ptosis of right eyelid: Secondary | ICD-10-CM | POA: Diagnosis not present

## 2019-04-28 DIAGNOSIS — Z825 Family history of asthma and other chronic lower respiratory diseases: Secondary | ICD-10-CM | POA: Diagnosis not present

## 2019-04-28 DIAGNOSIS — Z85828 Personal history of other malignant neoplasm of skin: Secondary | ICD-10-CM | POA: Diagnosis not present

## 2019-04-28 DIAGNOSIS — N400 Enlarged prostate without lower urinary tract symptoms: Secondary | ICD-10-CM | POA: Diagnosis not present

## 2019-04-28 DIAGNOSIS — R32 Unspecified urinary incontinence: Secondary | ICD-10-CM | POA: Diagnosis not present

## 2019-04-28 DIAGNOSIS — N529 Male erectile dysfunction, unspecified: Secondary | ICD-10-CM | POA: Diagnosis not present

## 2019-05-21 DIAGNOSIS — Z0100 Encounter for examination of eyes and vision without abnormal findings: Secondary | ICD-10-CM | POA: Diagnosis not present

## 2019-08-11 ENCOUNTER — Other Ambulatory Visit: Payer: Self-pay

## 2019-08-11 ENCOUNTER — Ambulatory Visit (INDEPENDENT_AMBULATORY_CARE_PROVIDER_SITE_OTHER): Payer: Medicare HMO | Admitting: Family Medicine

## 2019-08-11 ENCOUNTER — Encounter: Payer: Self-pay | Admitting: Family Medicine

## 2019-08-11 VITALS — BP 130/80 | HR 63 | Temp 98.1°F | Ht 71.0 in | Wt 191.0 lb

## 2019-08-11 DIAGNOSIS — R030 Elevated blood-pressure reading, without diagnosis of hypertension: Secondary | ICD-10-CM

## 2019-08-11 DIAGNOSIS — E782 Mixed hyperlipidemia: Secondary | ICD-10-CM

## 2019-08-11 DIAGNOSIS — N3941 Urge incontinence: Secondary | ICD-10-CM

## 2019-08-11 DIAGNOSIS — N401 Enlarged prostate with lower urinary tract symptoms: Secondary | ICD-10-CM

## 2019-08-11 LAB — LIPID PANEL
Cholesterol: 210 mg/dL — ABNORMAL HIGH (ref 0–200)
HDL: 45.2 mg/dL (ref >39.00)
LDL Cholesterol: 139 mg/dL — ABNORMAL HIGH (ref 0–99)
NonHDL: 164.91
Total CHOL/HDL Ratio: 5
Triglycerides: 129 mg/dL (ref 0.0–149.0)
VLDL: 25.8 mg/dL (ref 0.0–40.0)

## 2019-08-11 LAB — URINALYSIS, ROUTINE W REFLEX MICROSCOPIC
Bilirubin Urine: NEGATIVE
Hgb urine dipstick: NEGATIVE
Ketones, ur: NEGATIVE
Leukocytes,Ua: NEGATIVE
Nitrite: NEGATIVE
RBC / HPF: NONE SEEN (ref 0–?)
Specific Gravity, Urine: <=1.005 — AB (ref 1.000–1.030)
Total Protein, Urine: NEGATIVE
Urine Glucose: NEGATIVE
Urobilinogen, UA: 0.2 (ref 0.0–1.0)
WBC, UA: NONE SEEN (ref 0–?)
pH: 6.5 (ref 5.0–8.0)

## 2019-08-11 MED ORDER — MIRABEGRON ER 25 MG PO TB24: 25.0000 mg | ORAL_TABLET | Freq: Every day | ORAL | 1 refills | Status: DC

## 2019-08-11 NOTE — Patient Instructions (Addendum)
Starting medication for urge incontinence. Enlarged prostate could be contributing. myrbetriq was sent into pharmacy to see if this will help. Takes a month to work. Let me know how it is going. Can increase dose if we need to.   Urinary Incontinence  Urinary incontinence refers to a condition in which a person is unable to control where and when to pass urine. A person with this condition will urinate when he or she does not mean to (involuntarily). What are the causes? This condition may be caused by:  Medicines.  Infections.  Constipation.  Overactive bladder muscles.  Weak bladder muscles.  Weak pelvic floor muscles. These muscles provide support for the bladder, intestine, and, in women, the uterus.  Enlarged prostate in men. The prostate is a gland near the bladder. When it gets too big, it can pinch the urethra. With the urethra blocked, the bladder can weaken and lose the ability to empty properly.  Surgery.  Emotional factors, such as anxiety, stress, or post-traumatic stress disorder (PTSD).  Pelvic organ prolapse. This happens in women when organs shift out of place and into the vagina. This shift can prevent the bladder and urethra from working properly. What increases the risk? The following factors may make you more likely to develop this condition:  Older age.  Obesity and physical inactivity.  Pregnancy and childbirth.  Menopause.  Diseases that affect the nerves or spinal cord (neurological diseases).  Long-term (chronic) coughing. This can increase pressure on the bladder and pelvic floor muscles. What are the signs or symptoms? Symptoms may vary depending on the type of urinary incontinence you have. They include:  A sudden urge to urinate, but passing urine involuntarily before you can get to a bathroom (urge incontinence).  Suddenly passing urine with any activity that forces urine to pass, such as coughing, laughing, exercise, or sneezing (stress  incontinence).  Needing to urinate often, but urinating only a small amount, or constantly dribbling urine (overflow incontinence).  Urinating because you cannot get to the bathroom in time due to a physical disability, such as arthritis or injury, or communication and thinking problems, such as Alzheimer disease (functional incontinence). How is this diagnosed? This condition may be diagnosed based on:  Your medical history.  A physical exam.  Tests, such as: ? Urine tests. ? X-rays of your kidney and bladder. ? Ultrasound. ? CT scan. ? Cystoscopy. In this procedure, a health care provider inserts a tube with a light and camera (cystoscope) through the urethra and into the bladder in order to check for problems. ? Urodynamic testing. These tests assess how well the bladder, urethra, and sphincter can store and release urine. There are different types of urodynamic tests, and they vary depending on what the test is measuring. To help diagnose your condition, your health care provider may recommend that you keep a log of when you urinate and how much you urinate. How is this treated? Treatment for this condition depends on the type of incontinence that you have and its cause. Treatment may include:  Lifestyle changes, such as: ? Quitting smoking. ? Maintaining a healthy weight. ? Staying active. Try to get 150 minutes of moderate-intensity exercise every week. Ask your health care provider which activities are safe for you. ? Eating a healthy diet.  Avoid high-fat foods, like fried foods.  Avoid refined carbohydrates like white bread and white rice.  Limit how much alcohol and caffeine you drink.  Increase your fiber intake. Foods such as fresh fruits,  vegetables, beans, and whole grains are healthy sources of fiber.  Pelvic floor muscle exercises.  Bladder training, such as lengthening the amount of time between bathroom breaks, or using the bathroom at regular intervals.   Using techniques to suppress bladder urges. This can include distraction techniques or controlled breathing exercises.  Medicines to relax the bladder muscles and prevent bladder spasms.  Medicines to help slow or prevent the growth of a man's prostate.  Botox injections. These can help relax the bladder muscles.  Using pulses of electricity to help change bladder reflexes (electrical nerve stimulation).  For women, using a medical device to prevent urine leaks. This is a small, tampon-like, disposable device that is inserted into the urethra.  Injecting collagen or carbon beads (bulking agents) into the urinary sphincter. These can help thicken tissue and close the bladder opening.  Surgery. Follow these instructions at home: Lifestyle  Limit alcohol and caffeine. These can fill your bladder quickly and irritate it.  Keep yourself clean to help prevent odors and skin damage. Ask your doctor about special skin creams and cleansers that can protect the skin from urine.  Consider wearing pads or adult diapers. Make sure to change them regularly, and always change them right after experiencing incontinence. General instructions  Take over-the-counter and prescription medicines only as told by your health care provider.  Use the bathroom about every 3-4 hours, even if you do not feel the need to urinate. Try to empty your bladder completely every time. After urinating, wait a minute. Then try to urinate again.  Make sure you are in a relaxed position while urinating.  If your incontinence is caused by nerve problems, keep a log of the medicines you take and the times you go to the bathroom.  Keep all follow-up visits as told by your health care provider. This is important. Contact a health care provider if:  You have pain that gets worse.  Your incontinence gets worse. Get help right away if:  You have a fever or chills.  You are unable to urinate.  You have redness in your  groin area or down your legs. Summary  Urinary incontinence refers to a condition in which a person is unable to control where and when to pass urine.  This condition may be caused by medicines, infection, weak bladder muscles, weak pelvic floor muscles, enlargement of the prostate (in men), or surgery.  The following factors increase your risk for developing this condition: older age, obesity, pregnancy and childbirth, menopause, neurological diseases, and chronic coughing.  There are several types of urinary incontinence. They include urge incontinence, stress incontinence, overflow incontinence, and functional incontinence.  This condition is usually treated first with lifestyle and behavioral changes, such as quitting smoking, eating a healthier diet, and doing regular pelvic floor exercises. Other treatment options include medicines, bulking agents, medical devices, electrical nerve stimulation, or surgery. This information is not intended to replace advice given to you by your health care provider. Make sure you discuss any questions you have with your health care provider. Document Released: 12/14/2004 Document Revised: 11/16/2017 Document Reviewed: 02/15/2017 Elsevier Patient Education  2020 [employer].

## 2019-08-11 NOTE — Progress Notes (Signed)
Patient: Trevor Romero. MRN: [id number] DOB: 11/24/1945 PCP: Orma Flaming, MD     Subjective:  Chief Complaint  Patient presents with  . Hypertension    follow up    HPI: The patient is a 73 y.o. male who presents today for follow up on hypertension. He brought his home readings for me to review. His readings are to goal. Average range: 90-140/60-80. Average is around 130/78. Copies scanned into chart. He has no symptoms. He is walking daily.   Hyperlipidemia: he is fasting and ready to repeat labs. ascvd risk is 29%.   He also feels like he is starting to have more urinary incontinence. He states he has had an issue for " a long, long time." It has gotten worse over the last several months. He is having urine and fecal incontinence. colonoscopy recently and normal besides polyps. His main complaint is urge incontinence and he can't get to bathroom fast enough. Is having to use depends. He does have hx of BPH. Denies any blood in urine, foul smell, CVA tenderness.   Will also do flu shot today.   Review of Systems  Constitutional: Negative for chills, fatigue and fever.  HENT: Negative for congestion, postnasal drip, rhinorrhea and sore throat.   Eyes: Negative for visual disturbance.  Respiratory: Negative for cough and shortness of breath.   Cardiovascular: Negative for chest pain, palpitations and leg swelling.  Gastrointestinal: Negative for abdominal pain, diarrhea, nausea and vomiting.  Skin: Negative for rash.  Neurological: Negative for dizziness and headaches.  Psychiatric/Behavioral: Negative for sleep disturbance.    Allergies Patient has No Known Allergies.  Past Medical History Patient  has a past medical history of Bladder neck obstruction, ED (erectile dysfunction), HLD (hyperlipidemia), Hydrocele, Hypertension, and Urinary frequency.  Surgical History Patient  has a past surgical history that includes Vasectomy and Tonsillectomy and  adenoidectomy.  Family History Pateint's family history includes COPD in his father; Mental illness in his mother; Stroke in his maternal grandfather.  Social History Patient  reports that he has never smoked. He has never used smokeless tobacco. He reports current alcohol use of about 2.0 standard drinks of alcohol per week. He reports that he does not use drugs.    Objective: Vitals:   08/11/19 1318 08/11/19 1359  BP: 140/88 130/80  Pulse: 63   Temp: 98.1 F (36.7 C)   TempSrc: Skin   SpO2: 98%   Weight: 191 lb (86.6 kg)   Height: 5\' 11"  (1.803 m)     Body mass index is 26.64 kg/m.  Physical Exam Vitals signs reviewed.  Constitutional:      Appearance: Normal appearance. He is normal weight.  HENT:     Head: Normocephalic and atraumatic.  Neck:     Musculoskeletal: Normal range of motion and neck supple.  Cardiovascular:     Rate and Rhythm: Normal rate and regular rhythm.     Heart sounds: Normal heart sounds.  Pulmonary:     Effort: Pulmonary effort is normal.     Breath sounds: Normal breath sounds.  Abdominal:     General: Abdomen is flat. Bowel sounds are normal.     Palpations: Abdomen is soft.  Skin:    General: Skin is warm and dry.  Neurological:     General: No focal deficit present.     Mental Status: He is alert and oriented to person, place, and time.  Psychiatric:        Mood and Affect: Mood normal.  Behavior: Behavior normal.    Depression screen [facility] 2/9 08/11/2019 11/29/2018  Decreased Interest 1 0  Down, Depressed, Hopeless 0 0  PHQ - 2 Score 1 0   Fall Risk  08/11/2019  Falls in the past year? 0  Number falls in past yr: 0  Injury with Fall? 0       Assessment/plan: 1. Elevated blood-pressure reading without diagnosis of hypertension Home readings and repeat BP to goal. continue to monitor at home and will see him at regular follow up.   2. Hyperlipidemia, mixed ASCVD risk was 29%. He really wanted to repeat labs fasting.  Discussed this risk will likely not change as it's a risk due to multiple factors that he can not change. His mother is 16 years of age and still living. Discussed he will likely live another 10 years or longer so I do think he would benefit from a statin. Will get labs back and discuss further.  - Lipid panel  3. 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 Will do trial of myrbetriq and see if this helps. If insurance doesn't approve, may have to do a different incontinence drug. Discussed these are on BEER criteria and to use caution when taking. Side effects discussed. F/u in one month or as needed.  - Urinalysis, Routine w reflex microscopic - Urine Culture   Return if symptoms worsen or fail to improve.   Orma Flaming, MD [place]   08/11/2019

## 2019-08-13 LAB — URINE CULTURE
MICRO NUMBER:: 903787
Result:: NO GROWTH
SPECIMEN QUALITY:: ADEQUATE

## 2019-08-15 ENCOUNTER — Other Ambulatory Visit: Payer: Self-pay | Admitting: Family Medicine

## 2019-08-18 ENCOUNTER — Other Ambulatory Visit: Payer: Self-pay | Admitting: Family Medicine

## 2019-08-18 MED ORDER — OXYBUTYNIN CHLORIDE ER 5 MG PO TB24: 5.0000 mg | ORAL_TABLET | Freq: Every day | ORAL | 1 refills | Status: DC

## 2019-08-18 NOTE — Progress Notes (Signed)
Delsa Sale, I tried a lot of other urinary incontinence drugs for him and all say not covered. im not sure what drug coverage he has. I sent in a generic call ditropan xl. On good rx it is 33 dollars at [facility], but has to get goodrx card on line or here. I put him on low dose at 5mg . We cna always increase if he likes it and can tell a difference with the urinary complaints. Biggest side effects are dry mouth, constipation.   Orma Flaming, MD [place]

## 2019-08-19 ENCOUNTER — Other Ambulatory Visit: Payer: Self-pay

## 2019-08-19 ENCOUNTER — Telehealth: Payer: Self-pay | Admitting: Physical Therapy

## 2019-08-19 MED ORDER — ATORVASTATIN CALCIUM 10 MG PO TABS: 10.0000 mg | ORAL_TABLET | Freq: Every day | ORAL | 3 refills | Status: DC

## 2019-08-19 NOTE — Telephone Encounter (Signed)
Copied from [place] [phone]. Topic: General - Other >> Aug 19, 2019 11:19 AM Leward Quan A wrote: Reason for CRM: Patient called to inform Dr Rogers Blocker that the Atorvastatin 10 MG is covered by his insurance and that he would like a 90 day supply sent in please.

## 2019-08-19 NOTE — Telephone Encounter (Signed)
Per Dr. Rogers Blocker, ok to send in rx.

## 2019-08-19 NOTE — Progress Notes (Signed)
Spoke to patient and he states that he will contact his insurance co to see which urinary incontinence drug is covered.  I advised him about [email or web address] as an alternative if his insurance does not cover any of these medications.  He will contact us back with this information.

## 2019-08-20 DIAGNOSIS — R69 Illness, unspecified: Secondary | ICD-10-CM | POA: Diagnosis not present

## 2019-10-20 DIAGNOSIS — L570 Actinic keratosis: Secondary | ICD-10-CM | POA: Diagnosis not present

## 2019-10-20 DIAGNOSIS — Z85828 Personal history of other malignant neoplasm of skin: Secondary | ICD-10-CM | POA: Diagnosis not present

## 2019-10-20 DIAGNOSIS — D225 Melanocytic nevi of trunk: Secondary | ICD-10-CM | POA: Diagnosis not present

## 2019-10-20 DIAGNOSIS — D2262 Melanocytic nevi of left upper limb, including shoulder: Secondary | ICD-10-CM | POA: Diagnosis not present

## 2019-10-20 DIAGNOSIS — D485 Neoplasm of uncertain behavior of skin: Secondary | ICD-10-CM | POA: Diagnosis not present

## 2019-10-20 DIAGNOSIS — Z08 Encounter for follow-up examination after completed treatment for malignant neoplasm: Secondary | ICD-10-CM | POA: Diagnosis not present

## 2019-10-20 DIAGNOSIS — D2271 Melanocytic nevi of right lower limb, including hip: Secondary | ICD-10-CM | POA: Diagnosis not present

## 2019-10-20 DIAGNOSIS — D2261 Melanocytic nevi of right upper limb, including shoulder: Secondary | ICD-10-CM | POA: Diagnosis not present

## 2019-10-20 DIAGNOSIS — C44719 Basal cell carcinoma of skin of left lower limb, including hip: Secondary | ICD-10-CM | POA: Diagnosis not present

## 2019-12-02 ENCOUNTER — Other Ambulatory Visit: Payer: Self-pay

## 2019-12-02 ENCOUNTER — Ambulatory Visit (INDEPENDENT_AMBULATORY_CARE_PROVIDER_SITE_OTHER): Payer: Medicare HMO

## 2019-12-02 DIAGNOSIS — Z0000 Encounter for general adult medical examination without abnormal findings: Secondary | ICD-10-CM | POA: Diagnosis not present

## 2019-12-02 NOTE — Patient Instructions (Signed)
Trevor Romero , Thank you for taking time to come for your Medicare Wellness Visit. I appreciate your ongoing commitment to your health goals. Please review the following plan we discussed and let me know if I can assist you in the future.   Screening recommendations/referrals: Colorectal Screening: up to date; last colonoscopy 04/10/18  Vision and Dental Exams: Recommended annual ophthalmology exams for early detection of glaucoma and other disorders of the eye Recommended annual dental exams for proper oral hygiene  Vaccinations: Influenza vaccine: will will request the records from your administration Pneumococcal vaccine: up to date; last 01/04/16 Tdap vaccine: recommended; Please call your insurance company to determine your out of pocket expense. You also receive this vaccine at your local pharmacy or Health Dept. Shingles vaccine: Please call your insurance company to determine your out of pocket expense for the Shingrix vaccine. You may receive this vaccine at your local pharmacy. (please see attached information)   Advanced directives: Please bring a copy of your POA (Power of [place]) and/or Living Will to your next appointment.  Goals: Recommend to drink at least 6-8 8oz glasses of water per day and consume a balanced diet rich in fresh fruits and vegetables.   Next appointment: Please schedule your Annual Wellness Visit with your Nurse Health Advisor in one year.  Preventive Care 24 Years and Older, Male Preventive care refers to lifestyle choices and visits with your health care provider that can promote health and wellness. What does preventive care include?  A yearly physical exam. This is also called an annual well check.  Dental exams once or twice a year.  Routine eye exams. Ask your health care provider how often you should have your eyes checked.  Personal lifestyle choices, including:  Daily care of your teeth and gums.  Regular physical activity.  Eating a  healthy diet.  Avoiding tobacco and drug use.  Limiting alcohol use.  Practicing safe sex.  Taking low doses of aspirin every day if recommended by your health care provider..  Taking vitamin and mineral supplements as recommended by your health care provider. What happens during an annual well check? The services and screenings done by your health care provider during your annual well check will depend on your age, overall health, lifestyle risk factors, and family history of disease. Counseling  Your health care provider may ask you questions about your:  Alcohol use.  Tobacco use.  Drug use.  Emotional well-being.  Home and relationship well-being.  Sexual activity.  Eating habits.  History of falls.  Memory and ability to understand (cognition).  Work and work Statistician. Screening  You may have the following tests or measurements:  Height, weight, and BMI.  Blood pressure.  Lipid and cholesterol levels. These may be checked every 5 years, or more frequently if you are over 1 years old.  Skin check.  Lung cancer screening. You may have this screening every year starting at age 88 if you have a 30-pack-year history of smoking and currently smoke or have quit within the past 15 years.  Fecal occult blood test (FOBT) of the stool. You may have this test every year starting at age 56.  Flexible sigmoidoscopy or colonoscopy. You may have a sigmoidoscopy every 5 years or a colonoscopy every 10 years starting at age 82.  Prostate cancer screening. Recommendations will vary depending on your family history and other risks.  Hepatitis C blood test.  Hepatitis B blood test.  Sexually transmitted disease (STD) testing.  Diabetes  screening. This is done by checking your blood sugar (glucose) after you have not eaten for a while (fasting). You may have this done every 1-3 years.  Abdominal aortic aneurysm (AAA) screening. You may need this if you are a current  or former smoker.  Osteoporosis. You may be screened starting at age 28 if you are at high risk. Talk with your health care provider about your test results, treatment options, and if necessary, the need for more tests. Vaccines  Your health care provider may recommend certain vaccines, such as:  Influenza vaccine. This is recommended every year.  Tetanus, diphtheria, and acellular pertussis (Tdap, Td) vaccine. You may need a Td booster every 10 years.  Zoster vaccine. You may need this after age 74.  Pneumococcal 13-valent conjugate (PCV13) vaccine. One dose is recommended after age 59.  Pneumococcal polysaccharide (PPSV23) vaccine. One dose is recommended after age 53. Talk to your health care provider about which screenings and vaccines you need and how often you need them. This information is not intended to replace advice given to you by your health care provider. Make sure you discuss any questions you have with your health care provider. Document Released: 12/03/2015 Document Revised: 07/26/2016 Document Reviewed: 09/07/2015 Elsevier Interactive Patient Education  2017 Trevor Romero Prevention in the Home Falls can cause injuries. They can happen to people of all ages. There are many things you can do to make your home safe and to help prevent falls. What can I do on the outside of my home?  Regularly fix the edges of walkways and driveways and fix any cracks.  Remove anything that might make you trip as you walk through a door, such as a raised step or threshold.  Trim any bushes or trees on the path to your home.  Use bright outdoor lighting.  Clear any walking paths of anything that might make someone trip, such as rocks or tools.  Regularly check to see if handrails are loose or broken. Make sure that both sides of any steps have handrails.  Any raised decks and porches should have guardrails on the edges.  Have any leaves, snow, or ice cleared  regularly.  Use sand or salt on walking paths during winter.  Clean up any spills in your garage right away. This includes oil or grease spills. What can I do in the bathroom?  Use night lights.  Install grab bars by the toilet and in the tub and shower. Do not use towel bars as grab bars.  Use non-skid mats or decals in the tub or shower.  If you need to sit down in the shower, use a plastic, non-slip stool.  Keep the floor dry. Clean up any water that spills on the floor as soon as it happens.  Remove soap buildup in the tub or shower regularly.  Attach bath mats securely with double-sided non-slip rug tape.  Do not have throw rugs and other things on the floor that can make you trip. What can I do in the bedroom?  Use night lights.  Make sure that you have a light by your bed that is easy to reach.  Do not use any sheets or blankets that are too big for your bed. They should not hang down onto the floor.  Have a firm chair that has side arms. You can use this for support while you get dressed.  Do not have throw rugs and other things on the floor that can make  you trip. What can I do in the kitchen?  Clean up any spills right away.  Avoid walking on wet floors.  Keep items that you use a lot in easy-to-reach places.  If you need to reach something above you, use a strong step stool that has a grab bar.  Keep electrical cords out of the way.  Do not use floor polish or wax that makes floors slippery. If you must use wax, use non-skid floor wax.  Do not have throw rugs and other things on the floor that can make you trip. What can I do with my stairs?  Do not leave any items on the stairs.  Make sure that there are handrails on both sides of the stairs and use them. Fix handrails that are broken or loose. Make sure that handrails are as long as the stairways.  Check any carpeting to make sure that it is firmly attached to the stairs. Fix any carpet that is loose  or worn.  Avoid having throw rugs at the top or bottom of the stairs. If you do have throw rugs, attach them to the floor with carpet tape.  Make sure that you have a light switch at the top of the stairs and the bottom of the stairs. If you do not have them, ask someone to add them for you. What else can I do to help prevent falls?  Wear shoes that:  Do not have high heels.  Have rubber bottoms.  Are comfortable and fit you well.  Are closed at the toe. Do not wear sandals.  If you use a stepladder:  Make sure that it is fully opened. Do not climb a closed stepladder.  Make sure that both sides of the stepladder are locked into place.  Ask someone to hold it for you, if possible.  Clearly mark and make sure that you can see:  Any grab bars or handrails.  First and last steps.  Where the edge of each step is.  Use tools that help you move around (mobility aids) if they are needed. These include:  Canes.  Walkers.  Scooters.  Crutches.  Turn on the lights when you go into a dark area. Replace any light bulbs as soon as they burn out.  Set up your furniture so you have a clear path. Avoid moving your furniture around.  If any of your floors are uneven, fix them.  If there are any pets around you, be aware of where they are.  Review your medicines with your doctor. Some medicines can make you feel dizzy. This can increase your chance of falling. Ask your doctor what other things that you can do to help prevent falls. This information is not intended to replace advice given to you by your health care provider. Make sure you discuss any questions you have with your health care provider. Document Released: 09/02/2009 Document Revised: 04/13/2016 Document Reviewed: 12/11/2014 Elsevier Interactive Patient Education  2017 [employer].

## 2019-12-02 NOTE — Progress Notes (Signed)
This visit is being conducted via phone call due to the COVID-19 pandemic. This patient has given me verbal consent via phone to conduct this visit, patient states they are participating from their home address. Some vital signs may be absent or patient reported.   Patient identification: identified by name, DOB, and current address.  Location provider: [facility], Office Persons participating in the virtual visit: Denman George LPN, patient, and Dr. Billey Chang   Subjective:   Trevor Romero. is a 74 y.o. male who presents for Medicare Annual/Subsequent preventive examination.  Review of Systems:   Cardiac Risk Factors include: male gender;dyslipidemia;advanced age (>50men, >34 women)    Objective:    Vitals: There were no vitals taken for this visit.  There is no height or weight on file to calculate BMI.  Advanced Directives 12/02/2019  Does Patient Have a Medical Advance Directive? Yes  Type of Paramedic of [place];Living will  Does patient want to make changes to medical advance directive? No - Patient declined  Copy of [place] in Chart? No - copy requested    Tobacco Social History   Tobacco Use  Smoking Status Never Smoker  Smokeless Tobacco Never Used     Counseling given: Not Answered   Clinical Intake:  Pre-visit preparation completed: Yes  Pain : No/denies pain  Diabetes: No  How often do you need to have someone help you when you read instructions, pamphlets, or other written materials from your doctor or pharmacy?: 1 - Never  Interpreter Needed?: No  Information entered by :: Denman George LPN  Past Medical History:  Diagnosis Date  . Bladder neck obstruction   . ED (erectile dysfunction)   . HLD (hyperlipidemia)   . Hydrocele    left  . Hypertension   . Urinary frequency    Past Surgical History:  Procedure Laterality Date  . TONSILLECTOMY AND ADENOIDECTOMY    . VASECTOMY     Family  History  Problem Relation Age of Onset  . COPD Father   . Mental illness Mother   . Stroke Maternal Grandfather   . Cancer Neg Hx        Kidney,Prostate, Bladder   Social History   Socioeconomic History  . Marital status: Married    Spouse name: Not on file  . Number of children: Not on file  . Years of education: Not on file  . Highest education level: Not on file  Occupational History  . Not on file  Tobacco Use  . Smoking status: Never Smoker  . Smokeless tobacco: Never Used  Substance and Sexual Activity  . Alcohol use: Yes    Alcohol/week: 2.0 standard drinks    Types: 2 Glasses of wine per week    Comment: occasionally   . Drug use: No  . Sexual activity: Not Currently  Other Topics Concern  . Not on file  Social History Narrative  . Not on file   Social Determinants of Health   Financial Resource Strain:   . Difficulty of Paying Living Expenses: Not on file  Food Insecurity:   . Worried About Charity fundraiser in the Last Year: Not on file  . Ran Out of Food in the Last Year: Not on file  Transportation Needs:   . Lack of Transportation (Medical): Not on file  . Lack of Transportation (Non-Medical): Not on file  Physical Activity:   . Days of Exercise per Week: Not on file  .  Minutes of Exercise per Session: Not on file  Stress:   . Feeling of Stress : Not on file  Social Connections:   . Frequency of Communication with Friends and Family: Not on file  . Frequency of Social Gatherings with Friends and Family: Not on file  . Attends Religious Services: Not on file  . Active Member of Clubs or Organizations: Not on file  . Attends Archivist Meetings: Not on file  . Marital Status: Not on file    Outpatient Encounter Medications as of 12/02/2019  Medication Sig  . atorvastatin (LIPITOR) 10 MG tablet Take 1 tablet (10 mg total) by mouth daily.  Marland Kitchen Corn Dextrin (EQL FIBER SUPPLEMENT PO) Take by mouth.  . finasteride (PROSCAR) 5 MG tablet Take  1 tablet (5 mg total) by mouth daily.  . mirabegron ER (MYRBETRIQ) 25 MG TB24 tablet Take 1 tablet (25 mg total) by mouth daily.  . Multiple Vitamin (MULTIVITAMIN) tablet Take 1 tablet by mouth daily.  Marland Kitchen oxybutynin (DITROPAN XL) 5 MG 24 hr tablet Take 1 tablet (5 mg total) by mouth at bedtime.   No facility-administered encounter medications on file as of 12/02/2019.    Activities of Daily Living In your present state of health, do you have any difficulty performing the following activities: 12/02/2019  Hearing? N  Vision? N  Difficulty concentrating or making decisions? N  Walking or climbing stairs? N  Dressing or bathing? N  Doing errands, shopping? N  Preparing Food and eating ? N  Using the Toilet? N  In the past six months, have you accidently leaked urine? N  Do you have problems with loss of bowel control? N  Managing your Medications? N  Managing your Finances? N  Housekeeping or managing your Housekeeping? N  Some recent data might be hidden    Patient Care Team: Orma Flaming, MD as PCP - General (Family Medicine) Lorelee Market, MD as Referring Physician (Ophthalmology) Dasher, Rayvon Char, MD (Dermatology) Shon Hough, MD as Consulting Physician (Ophthalmology)   Assessment:   This is a routine wellness examination for Laren.  Exercise Activities and Dietary recommendations Current Exercise Habits: The patient does not participate in regular exercise at present  Goals   None     Fall Risk Fall Risk  12/02/2019 08/11/2019  Falls in the past year? 0 0  Number falls in past yr: - 0  Injury with Fall? 0 0  Follow up Falls evaluation completed;Education provided;Falls prevention discussed -   Is the patient's home free of loose throw rugs in walkways, pet beds, electrical cords, etc?   yes      Grab bars in the bathroom? yes      Handrails on the stairs?   yes      Adequate lighting?   yes  Depression Screen PHQ 2/9 Scores 12/02/2019 08/11/2019 11/29/2018   PHQ - 2 Score 0 1 0    Cognitive Function- no cognitive concerns at this time   6CIT Screen 12/02/2019  What Year? 0 points  What month? 0 points  What time? 0 points  Count back from 20 0 points  Months in reverse 0 points  Repeat phrase 0 points  Total Score 0    Immunization History  Administered Date(s) Administered  . Influenza-Unspecified 08/29/2012, 09/03/2013, 08/21/2014, 08/25/2015, 08/23/2016, 09/05/2017  . Pneumococcal Conjugate-13 01/04/2016  . Pneumococcal Polysaccharide-23 11/17/2013  . Zoster 12/07/2013    Qualifies for Shingles Vaccine? Discussed and patient will check with pharmacy for  coverage.  Patient education handout provided   Screening Tests Health Maintenance  Topic Date Due  . TETANUS/TDAP  12/27/1963  . INFLUENZA VACCINE  06/21/2019  . COLONOSCOPY  04/10/2028  . Hepatitis C Screening  Completed  . PNA vac Low Risk Adult  Completed   Cancer Screenings: Lung: Low Dose CT Chest recommended if Age 59-80 years, 30 pack-year currently smoking OR have quit w/in 15years. Patient does not qualify. Colorectal: colonoscopy 04/10/18    Plan:  I have personally reviewed and addressed the Medicare Annual Wellness questionnaire and have noted the following in the patient's chart:  A. Medical and social history B. Use of alcohol, tobacco or illicit drugs  C. Current medications and supplements D. Functional ability and status E.  Nutritional status F.  Physical activity G. Advance directives H. List of other physicians I.  Hospitalizations, surgeries, and ER visits in previous 12 months J.  [place] such as hearing and vision if needed, cognitive and depression L. Referrals, records requested, and appointments- will request records from flu shot administration   In addition, I have reviewed and discussed with patient certain preventive protocols, quality metrics, and best practice recommendations. A written personalized care plan for  preventive services as well as general preventive health recommendations were provided to patient.   Signed,  Denman George, LPN  Nurse Health Advisor   Nurse Notes: no additional

## 2019-12-12 ENCOUNTER — Ambulatory Visit: Payer: Medicare HMO | Attending: Internal Medicine

## 2019-12-12 DIAGNOSIS — Z23 Encounter for immunization: Secondary | ICD-10-CM | POA: Insufficient documentation

## 2019-12-12 NOTE — Progress Notes (Signed)
   [id number]  Name:  Trevor Romero.    MRN: [id number] DOB: 05-09-1946  12/12/2019  Trevor Romero was observed post Covid-19 immunization for 15 minutes without incidence. He was provided with Vaccine Information Sheet and instruction to access the V-Safe system.   Trevor Romero was instructed to call 911 with any severe reactions post vaccine: Marland Kitchen Difficulty breathing  . Swelling of your face and throat  . A fast heartbeat  . A bad rash all over your body  . Dizziness and weakness    Immunizations Administered    Name Date Dose VIS Date Route   Pfizer COVID-19 Vaccine 12/12/2019  5:38 PM 0.3 mL 10/31/2019 Intramuscular   Manufacturer: [place]   Lot: [id number]   [place]: [id number]

## 2020-01-02 ENCOUNTER — Ambulatory Visit: Payer: Medicare HMO | Attending: Internal Medicine

## 2020-01-02 DIAGNOSIS — Z23 Encounter for immunization: Secondary | ICD-10-CM | POA: Insufficient documentation

## 2020-01-02 NOTE — Progress Notes (Signed)
   [id number] [facility]  Name:  Trevor Romero.    MRN: [id number] DOB: 1946/01/14  01/02/2020  [name] was observed post Covid-19 immunization for 15 minutes without incidence. He was provided with Vaccine Information Sheet and instruction to access the V-Safe system.   [name] was instructed to call 911 with any severe reactions post vaccine: Marland Kitchen Difficulty breathing  . Swelling of your face and throat  . A fast heartbeat  . A bad rash all over your body  . Dizziness and weakness    Immunizations Administered    Name Date Dose VIS Date Route   Pfizer COVID-19 Vaccine 01/02/2020  5:41 PM 0.3 mL 10/31/2019 Intramuscular   Manufacturer: [place]   Lot: [id number]   [place]: [id number]

## 2020-01-09 DIAGNOSIS — L570 Actinic keratosis: Secondary | ICD-10-CM | POA: Diagnosis not present

## 2020-01-09 DIAGNOSIS — C44719 Basal cell carcinoma of skin of left lower limb, including hip: Secondary | ICD-10-CM | POA: Diagnosis not present

## 2020-02-17 DIAGNOSIS — Z825 Family history of asthma and other chronic lower respiratory diseases: Secondary | ICD-10-CM | POA: Diagnosis not present

## 2020-02-17 DIAGNOSIS — Z803 Family history of malignant neoplasm of breast: Secondary | ICD-10-CM | POA: Diagnosis not present

## 2020-02-17 DIAGNOSIS — R030 Elevated blood-pressure reading, without diagnosis of hypertension: Secondary | ICD-10-CM | POA: Diagnosis not present

## 2020-02-17 DIAGNOSIS — N400 Enlarged prostate without lower urinary tract symptoms: Secondary | ICD-10-CM | POA: Diagnosis not present

## 2020-02-17 DIAGNOSIS — E785 Hyperlipidemia, unspecified: Secondary | ICD-10-CM | POA: Diagnosis not present

## 2020-02-17 DIAGNOSIS — Z85828 Personal history of other malignant neoplasm of skin: Secondary | ICD-10-CM | POA: Diagnosis not present

## 2020-02-17 DIAGNOSIS — N529 Male erectile dysfunction, unspecified: Secondary | ICD-10-CM | POA: Diagnosis not present

## 2020-03-01 DIAGNOSIS — R69 Illness, unspecified: Secondary | ICD-10-CM | POA: Diagnosis not present

## 2020-03-25 DIAGNOSIS — H43812 Vitreous degeneration, left eye: Secondary | ICD-10-CM | POA: Diagnosis not present

## 2020-03-25 DIAGNOSIS — H52203 Unspecified astigmatism, bilateral: Secondary | ICD-10-CM | POA: Diagnosis not present

## 2020-03-25 DIAGNOSIS — H25813 Combined forms of age-related cataract, bilateral: Secondary | ICD-10-CM | POA: Diagnosis not present

## 2020-03-25 DIAGNOSIS — H02401 Unspecified ptosis of right eyelid: Secondary | ICD-10-CM | POA: Diagnosis not present

## 2020-05-10 DIAGNOSIS — Z85828 Personal history of other malignant neoplasm of skin: Secondary | ICD-10-CM | POA: Diagnosis not present

## 2020-05-10 DIAGNOSIS — L570 Actinic keratosis: Secondary | ICD-10-CM | POA: Diagnosis not present

## 2020-05-10 DIAGNOSIS — D225 Melanocytic nevi of trunk: Secondary | ICD-10-CM | POA: Diagnosis not present

## 2020-05-10 DIAGNOSIS — Z08 Encounter for follow-up examination after completed treatment for malignant neoplasm: Secondary | ICD-10-CM | POA: Diagnosis not present

## 2020-05-10 DIAGNOSIS — X32XXXA Exposure to sunlight, initial encounter: Secondary | ICD-10-CM | POA: Diagnosis not present

## 2020-07-05 ENCOUNTER — Other Ambulatory Visit: Payer: Self-pay | Admitting: Family Medicine

## 2020-07-05 DIAGNOSIS — N3941 Urge incontinence: Secondary | ICD-10-CM

## 2020-08-07 ENCOUNTER — Other Ambulatory Visit: Payer: Self-pay | Admitting: Family Medicine

## 2020-08-12 ENCOUNTER — Encounter: Payer: Self-pay | Admitting: Family Medicine

## 2020-08-12 ENCOUNTER — Ambulatory Visit (INDEPENDENT_AMBULATORY_CARE_PROVIDER_SITE_OTHER): Payer: Medicare HMO

## 2020-08-12 ENCOUNTER — Other Ambulatory Visit: Payer: Self-pay

## 2020-08-12 DIAGNOSIS — Z23 Encounter for immunization: Secondary | ICD-10-CM

## 2020-09-01 DIAGNOSIS — R69 Illness, unspecified: Secondary | ICD-10-CM | POA: Diagnosis not present

## 2020-09-15 DIAGNOSIS — R69 Illness, unspecified: Secondary | ICD-10-CM | POA: Diagnosis not present

## 2020-09-30 DIAGNOSIS — R69 Illness, unspecified: Secondary | ICD-10-CM | POA: Diagnosis not present

## 2020-10-01 ENCOUNTER — Other Ambulatory Visit: Payer: Self-pay | Admitting: Family Medicine

## 2020-10-01 DIAGNOSIS — N3941 Urge incontinence: Secondary | ICD-10-CM

## 2020-11-06 ENCOUNTER — Other Ambulatory Visit: Payer: Self-pay | Admitting: Family Medicine

## 2020-12-23 DIAGNOSIS — N400 Enlarged prostate without lower urinary tract symptoms: Secondary | ICD-10-CM | POA: Diagnosis not present

## 2020-12-23 DIAGNOSIS — Z85828 Personal history of other malignant neoplasm of skin: Secondary | ICD-10-CM | POA: Diagnosis not present

## 2020-12-23 DIAGNOSIS — E785 Hyperlipidemia, unspecified: Secondary | ICD-10-CM | POA: Diagnosis not present

## 2020-12-23 DIAGNOSIS — Z825 Family history of asthma and other chronic lower respiratory diseases: Secondary | ICD-10-CM | POA: Diagnosis not present

## 2021-01-03 ENCOUNTER — Other Ambulatory Visit: Payer: Self-pay | Admitting: Family Medicine

## 2021-01-03 DIAGNOSIS — N3941 Urge incontinence: Secondary | ICD-10-CM

## 2021-01-10 DIAGNOSIS — X32XXXA Exposure to sunlight, initial encounter: Secondary | ICD-10-CM | POA: Diagnosis not present

## 2021-01-10 DIAGNOSIS — D2262 Melanocytic nevi of left upper limb, including shoulder: Secondary | ICD-10-CM | POA: Diagnosis not present

## 2021-01-10 DIAGNOSIS — D2271 Melanocytic nevi of right lower limb, including hip: Secondary | ICD-10-CM | POA: Diagnosis not present

## 2021-01-10 DIAGNOSIS — L821 Other seborrheic keratosis: Secondary | ICD-10-CM | POA: Diagnosis not present

## 2021-01-10 DIAGNOSIS — Z85828 Personal history of other malignant neoplasm of skin: Secondary | ICD-10-CM | POA: Diagnosis not present

## 2021-01-10 DIAGNOSIS — L570 Actinic keratosis: Secondary | ICD-10-CM | POA: Diagnosis not present

## 2021-01-10 DIAGNOSIS — D2261 Melanocytic nevi of right upper limb, including shoulder: Secondary | ICD-10-CM | POA: Diagnosis not present

## 2021-01-10 DIAGNOSIS — D225 Melanocytic nevi of trunk: Secondary | ICD-10-CM | POA: Diagnosis not present

## 2021-03-31 DIAGNOSIS — H52203 Unspecified astigmatism, bilateral: Secondary | ICD-10-CM | POA: Diagnosis not present

## 2021-03-31 DIAGNOSIS — H43813 Vitreous degeneration, bilateral: Secondary | ICD-10-CM | POA: Diagnosis not present

## 2021-03-31 DIAGNOSIS — H25813 Combined forms of age-related cataract, bilateral: Secondary | ICD-10-CM | POA: Diagnosis not present

## 2021-03-31 DIAGNOSIS — H02401 Unspecified ptosis of right eyelid: Secondary | ICD-10-CM | POA: Diagnosis not present

## 2021-03-31 DIAGNOSIS — Z0100 Encounter for examination of eyes and vision without abnormal findings: Secondary | ICD-10-CM | POA: Diagnosis not present

## 2021-04-04 ENCOUNTER — Other Ambulatory Visit: Payer: Self-pay

## 2021-04-04 ENCOUNTER — Other Ambulatory Visit: Payer: Self-pay | Admitting: Family Medicine

## 2021-04-04 DIAGNOSIS — N3941 Urge incontinence: Secondary | ICD-10-CM

## 2021-04-08 ENCOUNTER — Other Ambulatory Visit: Payer: Self-pay

## 2021-04-08 ENCOUNTER — Encounter: Payer: Self-pay | Admitting: Family Medicine

## 2021-04-08 ENCOUNTER — Ambulatory Visit (INDEPENDENT_AMBULATORY_CARE_PROVIDER_SITE_OTHER): Payer: Medicare HMO | Admitting: Family Medicine

## 2021-04-08 VITALS — BP 140/80 | HR 64 | Temp 98.1°F | Ht 71.0 in | Wt 195.8 lb

## 2021-04-08 DIAGNOSIS — E782 Mixed hyperlipidemia: Secondary | ICD-10-CM

## 2021-04-08 DIAGNOSIS — R351 Nocturia: Secondary | ICD-10-CM | POA: Diagnosis not present

## 2021-04-08 DIAGNOSIS — N3941 Urge incontinence: Secondary | ICD-10-CM | POA: Diagnosis not present

## 2021-04-08 DIAGNOSIS — N401 Enlarged prostate with lower urinary tract symptoms: Secondary | ICD-10-CM | POA: Diagnosis not present

## 2021-04-08 DIAGNOSIS — R030 Elevated blood-pressure reading, without diagnosis of hypertension: Secondary | ICD-10-CM

## 2021-04-08 LAB — COMPREHENSIVE METABOLIC PANEL
ALT: 14 U/L (ref 0–53)
AST: 19 U/L (ref 0–37)
Albumin: 4.1 g/dL (ref 3.5–5.2)
Alkaline Phosphatase: 63 U/L (ref 39–117)
BUN: 18 mg/dL (ref 6–23)
CO2: 28 mEq/L (ref 19–32)
Calcium: 8.9 mg/dL (ref 8.4–10.5)
Chloride: 106 mEq/L (ref 96–112)
Creatinine, Ser: 1.08 mg/dL (ref 0.40–1.50)
GFR: 66.77 mL/min (ref >60.00)
Glucose, Bld: 90 mg/dL (ref 70–99)
Potassium: 4 mEq/L (ref 3.5–5.1)
Sodium: 140 mEq/L (ref 135–145)
Total Bilirubin: 0.6 mg/dL (ref 0.2–1.2)
Total Protein: 6.6 g/dL (ref 6.0–8.3)

## 2021-04-08 LAB — CBC WITH DIFFERENTIAL/PLATELET
Basophils Absolute: 0 10*3/uL (ref 0.0–0.1)
Basophils Relative: 0.5 % (ref 0.0–3.0)
Eosinophils Absolute: 0.1 10*3/uL (ref 0.0–0.7)
Eosinophils Relative: 1.4 % (ref 0.0–5.0)
HCT: 44.8 % (ref 39.0–52.0)
Hemoglobin: 15.3 g/dL (ref 13.0–17.0)
Lymphocytes Relative: 21.3 % (ref 12.0–46.0)
Lymphs Abs: 1.5 10*3/uL (ref 0.7–4.0)
MCHC: 34.2 g/dL (ref 30.0–36.0)
MCV: 94.3 fl (ref 78.0–100.0)
Monocytes Absolute: 0.5 10*3/uL (ref 0.1–1.0)
Monocytes Relative: 6.9 % (ref 3.0–12.0)
Neutro Abs: 4.8 10*3/uL (ref 1.4–7.7)
Neutrophils Relative %: 69.9 % (ref 43.0–77.0)
Platelets: 226 10*3/uL (ref 150.0–400.0)
RBC: 4.75 Mil/uL (ref 4.22–5.81)
RDW: 12.5 % (ref 11.5–15.5)
WBC: 6.9 10*3/uL (ref 4.0–10.5)

## 2021-04-08 LAB — LIPID PANEL
Cholesterol: 133 mg/dL (ref 0–200)
HDL: 41.9 mg/dL (ref >39.00)
LDL Cholesterol: 68 mg/dL (ref 0–99)
NonHDL: 91.49
Total CHOL/HDL Ratio: 3
Triglycerides: 116 mg/dL (ref 0.0–149.0)
VLDL: 23.2 mg/dL (ref 0.0–40.0)

## 2021-04-08 LAB — PSA: PSA: 0.29 ng/mL (ref 0.10–4.00)

## 2021-04-08 MED ORDER — ATORVASTATIN CALCIUM 10 MG PO TABS: 1.0000 | ORAL_TABLET | Freq: Every day | ORAL | 3 refills | Status: DC

## 2021-04-08 MED ORDER — FINASTERIDE 5 MG PO TABS: 5.0000 mg | ORAL_TABLET | Freq: Every day | ORAL | 3 refills | Status: DC

## 2021-04-08 NOTE — Progress Notes (Signed)
Patient: Trevor Romero. MRN: [id number] DOB: [birth year] PCP: Orma Flaming, MD     Subjective:  Chief Complaint  Patient presents with  . Hyperlipidemia  . Benign Prostatic Hypertrophy    HPI: The patient is a 75 y.o. male who presents today for Hyperlipidemia/BPH and blood pressure monitoring. No concerns today.  Hyperlipidemia: he is fasting and ready to repeat labs. ascvd risk is 29%. Currently on lipitor 10mg /day. He has eaten about a 200 calorie protein bar today. Taking medication as prescribed. No side effects. No hx of CAD/CVA in first degree relative. Maternal grandfather CVA hx.   Elevated blood pressure He has a log with him and his readings are all to goal. Average is around 135/80. Range from 127-149/70-80. Asymptomatic. He is walking for exercise. His diet is well balanced.   BPH Not followed by urology. No issues today. No complaints. nocturia x 2-3x/times at baseline. No struggling to urinate. No weak stream.   Review of Systems  Constitutional: Negative for chills, fatigue and fever.  HENT: Negative for dental problem, ear pain, hearing loss and trouble swallowing.   Eyes: Negative for visual disturbance.  Respiratory: Negative for cough, chest tightness and shortness of breath.   Cardiovascular: Negative for chest pain, palpitations and leg swelling.  Gastrointestinal: Negative for abdominal pain, blood in stool, diarrhea and nausea.  Endocrine: Negative for cold intolerance, polydipsia, polyphagia and polyuria.  Genitourinary: Negative for difficulty urinating, dysuria and hematuria.  Musculoskeletal: Negative for arthralgias.  Skin: Negative for rash.  Neurological: Negative for dizziness and headaches.  Psychiatric/Behavioral: Negative for dysphoric mood and sleep disturbance. The patient is not nervous/anxious.     Allergies Patient has No Known Allergies.  Past Medical History Patient  has a past medical history of Bladder neck obstruction, ED  (erectile dysfunction), HLD (hyperlipidemia), Hydrocele, Hypertension, and Urinary frequency.  Surgical History Patient  has a past surgical history that includes Vasectomy and Tonsillectomy and adenoidectomy.  Family History Pateint's family history includes COPD in his father; Mental illness in his mother; Stroke in his maternal grandfather.  Social History Patient  reports that he has never smoked. He has never used smokeless tobacco. He reports current alcohol use of about 2.0 standard drinks of alcohol per week. He reports that he does not use drugs.    Objective: Vitals:   04/08/21 1021 04/08/21 1106  BP: (!) 148/88 140/80  Pulse: 64   Temp: 98.1 F (36.7 C)   TempSrc: Temporal   SpO2: 98%   Weight: 195 lb 12.8 oz (88.8 kg)   Height: 5\' 11"  (1.803 m)     Body mass index is 27.31 kg/m.  Physical Exam Vitals reviewed.  Constitutional:      Appearance: Normal appearance. He is well-developed and normal weight.  HENT:     Head: Normocephalic and atraumatic.     Right Ear: Tympanic membrane, ear canal and external ear normal.     Left Ear: Tympanic membrane, ear canal and external ear normal.  Eyes:     Extraocular Movements: Extraocular movements intact.     Conjunctiva/sclera: Conjunctivae normal.     Pupils: Pupils are equal, round, and reactive to light.  Neck:     Thyroid: No thyromegaly.     Vascular: No carotid bruit.  Cardiovascular:     Rate and Rhythm: Normal rate and regular rhythm.     Pulses: Normal pulses.     Heart sounds: Normal heart sounds. No murmur heard.   Pulmonary:  Effort: Pulmonary effort is normal.     Breath sounds: Normal breath sounds.  Abdominal:     General: Abdomen is flat. Bowel sounds are normal. There is no distension.     Palpations: Abdomen is soft.     Tenderness: There is no abdominal tenderness.  Musculoskeletal:     Cervical back: Normal range of motion and neck supple.  Lymphadenopathy:     Cervical: No cervical  adenopathy.  Skin:    General: Skin is warm and dry.     Capillary Refill: Capillary refill takes less than 2 seconds.     Findings: No rash.  Neurological:     General: No focal deficit present.     Mental Status: He is alert and oriented to person, place, and time.     Cranial Nerves: No cranial nerve deficit.     Coordination: Coordination normal.     Deep Tendon Reflexes: Reflexes normal.  Psychiatric:        Mood and Affect: Mood normal.        Behavior: Behavior normal.        Assessment/plan: 1. Hyperlipidemia, mixed Labs today. Continue statin medication. Tolerating well. F/u in one year.  - CBC with Differential/Platelet - Comprehensive metabolic panel - Lipid panel  2. Elevated blood pressure reading Home readings all to goal. Repeat right at cusp. Continue to monitor.   3. Benign prostatic hyperplasia with nocturia  - PSA    Return in about 1 year (around 04/08/2022) for routine HLD/bp check with dr. Jerline Pain .   Orma Flaming, MD [place]   04/08/2021

## 2021-04-08 NOTE — Patient Instructions (Signed)
-  okay! Checking labs and meds filled x 1 year.   F/u in one year with dr. Jerline Pain. Thank you for letting me take care of you! Dr. Rogers Blocker

## 2021-11-20 HISTORY — PX: CATARACT EXTRACTION: SUR2

## 2021-12-26 DIAGNOSIS — H43813 Vitreous degeneration, bilateral: Secondary | ICD-10-CM | POA: Diagnosis not present

## 2021-12-26 DIAGNOSIS — H02403 Unspecified ptosis of bilateral eyelids: Secondary | ICD-10-CM | POA: Diagnosis not present

## 2021-12-26 DIAGNOSIS — H2513 Age-related nuclear cataract, bilateral: Secondary | ICD-10-CM | POA: Diagnosis not present

## 2021-12-26 DIAGNOSIS — H25041 Posterior subcapsular polar age-related cataract, right eye: Secondary | ICD-10-CM | POA: Diagnosis not present

## 2022-01-10 DIAGNOSIS — D2262 Melanocytic nevi of left upper limb, including shoulder: Secondary | ICD-10-CM | POA: Diagnosis not present

## 2022-01-10 DIAGNOSIS — D2271 Melanocytic nevi of right lower limb, including hip: Secondary | ICD-10-CM | POA: Diagnosis not present

## 2022-01-10 DIAGNOSIS — L570 Actinic keratosis: Secondary | ICD-10-CM | POA: Diagnosis not present

## 2022-01-10 DIAGNOSIS — D2261 Melanocytic nevi of right upper limb, including shoulder: Secondary | ICD-10-CM | POA: Diagnosis not present

## 2022-01-10 DIAGNOSIS — X32XXXA Exposure to sunlight, initial encounter: Secondary | ICD-10-CM | POA: Diagnosis not present

## 2022-01-10 DIAGNOSIS — Z85828 Personal history of other malignant neoplasm of skin: Secondary | ICD-10-CM | POA: Diagnosis not present

## 2022-01-10 DIAGNOSIS — L820 Inflamed seborrheic keratosis: Secondary | ICD-10-CM | POA: Diagnosis not present

## 2022-01-10 DIAGNOSIS — L538 Other specified erythematous conditions: Secondary | ICD-10-CM | POA: Diagnosis not present

## 2022-01-12 DIAGNOSIS — H25811 Combined forms of age-related cataract, right eye: Secondary | ICD-10-CM | POA: Diagnosis not present

## 2022-01-12 DIAGNOSIS — H25041 Posterior subcapsular polar age-related cataract, right eye: Secondary | ICD-10-CM | POA: Diagnosis not present

## 2022-01-12 DIAGNOSIS — H2511 Age-related nuclear cataract, right eye: Secondary | ICD-10-CM | POA: Diagnosis not present

## 2022-01-12 DIAGNOSIS — H25011 Cortical age-related cataract, right eye: Secondary | ICD-10-CM | POA: Diagnosis not present

## 2022-04-10 ENCOUNTER — Encounter: Payer: Self-pay | Admitting: Family Medicine

## 2022-04-10 ENCOUNTER — Ambulatory Visit (INDEPENDENT_AMBULATORY_CARE_PROVIDER_SITE_OTHER): Payer: Medicare HMO | Admitting: Family Medicine

## 2022-04-10 VITALS — BP 128/75 | HR 67 | Temp 98.1°F | Ht 71.0 in | Wt 193.0 lb

## 2022-04-10 DIAGNOSIS — Z0001 Encounter for general adult medical examination with abnormal findings: Secondary | ICD-10-CM | POA: Diagnosis not present

## 2022-04-10 DIAGNOSIS — R351 Nocturia: Secondary | ICD-10-CM

## 2022-04-10 DIAGNOSIS — N3941 Urge incontinence: Secondary | ICD-10-CM

## 2022-04-10 DIAGNOSIS — R739 Hyperglycemia, unspecified: Secondary | ICD-10-CM | POA: Diagnosis not present

## 2022-04-10 DIAGNOSIS — N401 Enlarged prostate with lower urinary tract symptoms: Secondary | ICD-10-CM

## 2022-04-10 DIAGNOSIS — E782 Mixed hyperlipidemia: Secondary | ICD-10-CM | POA: Diagnosis not present

## 2022-04-10 LAB — COMPREHENSIVE METABOLIC PANEL
ALT: 12 U/L (ref 0–53)
AST: 18 U/L (ref 0–37)
Albumin: 4 g/dL (ref 3.5–5.2)
Alkaline Phosphatase: 60 U/L (ref 39–117)
BUN: 18 mg/dL (ref 6–23)
CO2: 26 mEq/L (ref 19–32)
Calcium: 9.1 mg/dL (ref 8.4–10.5)
Chloride: 103 mEq/L (ref 96–112)
Creatinine, Ser: 1.08 mg/dL (ref 0.40–1.50)
GFR: 66.3 mL/min (ref >60.00)
Glucose, Bld: 89 mg/dL (ref 70–99)
Potassium: 3.9 mEq/L (ref 3.5–5.1)
Sodium: 136 mEq/L (ref 135–145)
Total Bilirubin: 0.6 mg/dL (ref 0.2–1.2)
Total Protein: 6.6 g/dL (ref 6.0–8.3)

## 2022-04-10 LAB — LIPID PANEL
Cholesterol: 122 mg/dL (ref 0–200)
HDL: 38.5 mg/dL — ABNORMAL LOW (ref >39.00)
LDL Cholesterol: 58 mg/dL (ref 0–99)
NonHDL: 83.55
Total CHOL/HDL Ratio: 3
Triglycerides: 126 mg/dL (ref 0.0–149.0)
VLDL: 25.2 mg/dL (ref 0.0–40.0)

## 2022-04-10 LAB — CBC
HCT: 43.2 % (ref 39.0–52.0)
Hemoglobin: 14.6 g/dL (ref 13.0–17.0)
MCHC: 33.8 g/dL (ref 30.0–36.0)
MCV: 95.3 fl (ref 78.0–100.0)
Platelets: 216 10*3/uL (ref 150.0–400.0)
RBC: 4.54 Mil/uL (ref 4.22–5.81)
RDW: 12.9 % (ref 11.5–15.5)
WBC: 8.1 10*3/uL (ref 4.0–10.5)

## 2022-04-10 LAB — HEMOGLOBIN A1C: Hgb A1c MFr Bld: 5.8 % (ref 4.6–6.5)

## 2022-04-10 LAB — TSH: TSH: 3.6 u[IU]/mL (ref 0.35–5.50)

## 2022-04-10 LAB — PSA: PSA: 0.26 ng/mL (ref 0.10–4.00)

## 2022-04-10 NOTE — Assessment & Plan Note (Signed)
Doing well on Proscar 5 mg daily.  We will continue.  Does not need refill today.  Check PSA.

## 2022-04-10 NOTE — Assessment & Plan Note (Signed)
On Lipitor 10 mg daily.  Tolerating well.  We will check lipids today.

## 2022-04-10 NOTE — Progress Notes (Signed)
Chief Complaint:  Trevor Romero. is a 76 y.o. male who presents today for his annual comprehensive physical exam.    Assessment/Plan:  Chronic Problems Addressed Today: Hyperlipidemia, mixed On Lipitor 10 mg daily.  Tolerating well.  We will check lipids today.  Benign prostatic hyperplasia with lower urinary tract symptoms Doing well on Proscar 5 mg daily.  We will continue.  Does not need refill today.  Check PSA.  Preventative Healthcare: Check labs.  Will get tetanus and shingles vaccine at pharmacy.  Due for colonoscopy next year.  Patient Counseling(The following topics were reviewed and/or handout was given):  -Nutrition: Stressed importance of moderation in sodium/caffeine intake, saturated fat and cholesterol, caloric balance, sufficient intake of fresh fruits, vegetables, and fiber.  -Stressed the importance of regular exercise.   -Substance Abuse: Discussed cessation/primary prevention of tobacco, alcohol, or other drug use; driving or other dangerous activities under the influence; availability of treatment for abuse.   -Injury prevention: Discussed safety belts, safety helmets, smoke detector, smoking near bedding or upholstery.   -Sexuality: Discussed sexually transmitted diseases, partner selection, use of condoms, avoidance of unintended pregnancy and contraceptive alternatives.   -Dental health: Discussed importance of regular tooth brushing, flossing, and dental visits.  -Health maintenance and immunizations reviewed. Please refer to Health maintenance section.  Return to care in 1 year for next preventative visit.     Subjective:  HPI:  He has no acute complaints today.   Lifestyle Diet: Balanced. Plenty of fruits and vegetables.  Exercise: Goes to [facility] and likes walking dog     04/10/2022   10:42 AM  Depression screen PHQ 2/9  Decreased Interest 0  Down, Depressed, Hopeless 0  PHQ - 2 Score 0    Health Maintenance Due  Topic Date Due    TETANUS/TDAP  Never done   Zoster Vaccines- Shingrix (1 of 2) Never done     ROS: Per HPI, otherwise a complete review of systems was negative.   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 90 tablet 3   finasteride (PROSCAR) 5 MG tablet Take 1 tablet (5 mg total) by mouth daily. 90 tablet 3   Multiple Vitamin (MULTIVITAMIN) tablet Take 1 tablet by mouth daily.     No current facility-administered medications for this visit.    Allergies-reviewed and updated No Known Allergies  Social History   Socioeconomic History   Marital status: Married  Spouse name: Not on file   Number of children: Not on file   Years of education: Not on file   Highest education level: Not on file  Occupational History   Not on file  Tobacco Use   Smoking status: Never   Smokeless tobacco: Never  Vaping Use   Vaping Use: Never used  Substance and Sexual Activity   Alcohol use: Yes    Alcohol/week: 2.0 standard drinks    Types: 2 Glasses of wine per week     Comment: occasionally    Drug use: No   Sexual activity: Not Currently  Other Topics Concern   Not on file  Social History Narrative   Not on file   Social Determinants of Health   Financial Resource Strain: Not on file  Food Insecurity: Not on file  Transportation Needs: Not on file  Physical Activity: Not on file  Stress: Not on file  Social Connections: Not on file        Objective:  Physical Exam: BP 128/75   Pulse 67   Temp 98.1 F (36.7 C) (Temporal)   Ht '5\' 11"'$  (1.803 m)   Wt 193 lb (87.5 kg)   SpO2 97%   BMI 26.92 kg/m   Body mass index is 26.92 kg/m. Wt Readings from Last 3 Encounters:  04/10/22 193 lb (87.5 kg)  04/08/21 195 lb 12.8 oz (88.8 kg)  08/11/19 191 lb (86.6 kg)   Gen: NAD, resting comfortably HEENT: TMs normal bilaterally. OP clear. No thyromegaly noted.  CV: RRR with no murmurs appreciated Pulm: NWOB, CTAB with no crackles, wheezes, or rhonchi GI: Normal bowel sounds present. Soft, Nontender, Nondistended. MSK: no edema, cyanosis, or clubbing noted Skin: warm, dry Neuro: CN2-12 grossly intact. Strength 5/5 in upper and lower extremities. Reflexes symmetric and intact bilaterally.  Psych: Normal affect and thought content     Senon Nixon M. Jerline Pain, MD 04/10/2022 11:18 AM

## 2022-04-10 NOTE — Patient Instructions (Signed)
It was very nice to see you today!  We will check blood work today.  Please continue to work on diet and exercise.  No medication changes today.  I will see back in 1 year for your next physical.  Come back to see me sooner if needed.  Take care, Dr Jerline Pain  PLEASE NOTE:  If you had any lab tests please let Trevor Romero know if you have not heard back within a few days. You may see your results on mychart before we have a chance to review them but we will give you a call once they are reviewed by Trevor Romero. If we ordered any referrals today, please let Trevor Romero know if you have not heard from their office within the next week.   Please try these tips to maintain a healthy lifestyle:  Eat at least 3 REAL meals and 1-2 snacks per day.  Aim for no more than 5 hours between eating.  If you eat breakfast, please do so within one hour of getting up.   Each meal should contain half fruits/vegetables, one quarter protein, and one quarter carbs (no bigger than a computer mouse)  Cut down on sweet beverages. This includes juice, soda, and sweet tea.   Drink at least 1 glass of water with each meal and aim for at least 8 glasses per day  Exercise at least 150 minutes every week.    Preventive Care 52 Years and Older, Male Preventive care refers to lifestyle choices and visits with your health care provider that can promote health and wellness. Preventive care visits are also called wellness exams. What can I expect for my preventive care visit? Counseling During your preventive care visit, your health care provider may ask about your: Medical history, including: Past medical problems. Family medical history. History of falls. Current health, including: Emotional well-being. Home life and relationship well-being. Sexual activity. Memory and ability to understand (cognition). Lifestyle, including: Alcohol, nicotine or tobacco, and drug use. Access to firearms. Diet, exercise, and sleep habits. Work and  work Statistician. Sunscreen use. Safety issues such as seatbelt and bike helmet use. Physical exam Your health care provider will check your: Height and weight. These may be used to calculate your BMI (body mass index). BMI is a measurement that tells if you are at a healthy weight. Waist circumference. This measures the distance around your waistline. This measurement also tells if you are at a healthy weight and may help predict your risk of certain diseases, such as type 2 diabetes and high blood pressure. Heart rate and blood pressure. Body temperature. Skin for abnormal spots. What immunizations do I need?  Vaccines are usually given at various ages, according to a schedule. Your health care provider will recommend vaccines for you based on your age, medical history, and lifestyle or other factors, such as travel or where you work. What tests do I need? Screening Your health care provider may recommend screening tests for certain conditions. This may include: Lipid and cholesterol levels. Diabetes screening. This is done by checking your blood sugar (glucose) after you have not eaten for a while (fasting). Hepatitis C test. Hepatitis B test. HIV (human immunodeficiency virus) test. STI (sexually transmitted infection) testing, if you are at risk. Lung cancer screening. Colorectal cancer screening. Prostate cancer screening. Abdominal aortic aneurysm (AAA) screening. You may need this if you are a current or former smoker. Talk with your health care provider about your test results, treatment options, and if necessary, the need for  more tests. Follow these instructions at home: Eating and drinking  Eat a diet that includes fresh fruits and vegetables, whole grains, lean protein, and low-fat dairy products. Limit your intake of foods with high amounts of sugar, saturated fats, and salt. Take vitamin and mineral supplements as recommended by your health care provider. Do not drink  alcohol if your health care provider tells you not to drink. If you drink alcohol: Limit how much you have to 0-2 drinks a day. Know how much alcohol is in your drink. In the [place] one drink equals one 12 oz bottle of beer (355 mL), one 5 oz glass of wine (148 mL), or one 1 oz glass of hard liquor (44 mL). Lifestyle Brush your teeth every morning and night with fluoride toothpaste. Floss one time each day. Exercise for at least 30 minutes 5 or more days each week. Do not use any products that contain nicotine or tobacco. These products include cigarettes, chewing tobacco, and vaping devices, such as e-cigarettes. If you need help quitting, ask your health care provider. Do not use drugs. If you are sexually active, practice safe sex. Use a condom or other form of protection to prevent STIs. Take aspirin only as told by your health care provider. Make sure that you understand how much to take and what form to take. Work with your health care provider to find out whether it is safe and beneficial for you to take aspirin daily. Ask your health care provider if you need to take a cholesterol-lowering medicine (statin). Find healthy ways to manage stress, such as: Meditation, yoga, or listening to music. Journaling. Talking to a trusted person. Spending time with friends and family. Safety Always wear your seat belt while driving or riding in a vehicle. Do not drive: If you have been drinking alcohol. Do not ride with someone who has been drinking. When you are tired or distracted. While texting. If you have been using any mind-altering substances or drugs. Wear a helmet and other protective equipment during sports activities. If you have firearms in your house, make sure you follow all gun safety procedures. Minimize exposure to UV radiation to reduce your risk of skin cancer. What's next? Visit your health care provider once a year for an annual wellness visit. Ask your health care  provider how often you should have your eyes and teeth checked. Stay up to date on all vaccines. This information is not intended to replace advice given to you by your health care provider. Make sure you discuss any questions you have with your health care provider. Document Revised: 05/04/2021 Document Reviewed: 05/04/2021 Elsevier Patient Education  [place].

## 2022-04-11 NOTE — Progress Notes (Signed)
Please inform patient of the following:  Good news! Labs are all stable. Do not need to make any changes to his treatment plan at this time. We can recheck in 1 year.  Trevor Romero. Jerline Pain, MD 04/11/2022 10:04 AM

## 2022-04-22 ENCOUNTER — Other Ambulatory Visit: Payer: Self-pay | Admitting: Family Medicine

## 2022-04-22 DIAGNOSIS — N3941 Urge incontinence: Secondary | ICD-10-CM

## 2022-07-24 ENCOUNTER — Other Ambulatory Visit: Payer: Self-pay | Admitting: Family Medicine

## 2022-07-24 DIAGNOSIS — N3941 Urge incontinence: Secondary | ICD-10-CM

## 2022-07-25 DIAGNOSIS — H903 Sensorineural hearing loss, bilateral: Secondary | ICD-10-CM | POA: Diagnosis not present

## 2022-08-16 DIAGNOSIS — H02403 Unspecified ptosis of bilateral eyelids: Secondary | ICD-10-CM | POA: Diagnosis not present

## 2022-08-16 DIAGNOSIS — H25012 Cortical age-related cataract, left eye: Secondary | ICD-10-CM | POA: Diagnosis not present

## 2022-08-16 DIAGNOSIS — H2512 Age-related nuclear cataract, left eye: Secondary | ICD-10-CM | POA: Diagnosis not present

## 2022-10-30 ENCOUNTER — Other Ambulatory Visit: Payer: Self-pay | Admitting: Family Medicine

## 2022-10-30 DIAGNOSIS — N3941 Urge incontinence: Secondary | ICD-10-CM

## 2022-11-07 ENCOUNTER — Other Ambulatory Visit: Payer: Self-pay | Admitting: Family Medicine

## 2022-12-21 ENCOUNTER — Ambulatory Visit (INDEPENDENT_AMBULATORY_CARE_PROVIDER_SITE_OTHER): Payer: Medicare HMO

## 2022-12-21 VITALS — BP 130/68 | HR 68 | Temp 98.0°F | Wt 195.2 lb

## 2022-12-21 DIAGNOSIS — Z0000 Encounter for general adult medical examination without abnormal findings: Secondary | ICD-10-CM | POA: Diagnosis not present

## 2022-12-21 NOTE — Progress Notes (Addendum)
Subjective:   Trevor Porte. is a 77 y.o. male who presents for Medicare Annual/Subsequent preventive examination.  Review of Systems     Cardiac Risk Factors include: advanced age (>57mn, >>11women);dyslipidemia;male gender     Objective:    Today's Vitals   12/21/22 1519  BP: 130/68  Pulse: 68  Temp: 98 F (36.7 C)  SpO2: 96%  Weight: 195 lb 3.2 oz (88.5 kg)   Body mass index is 27.22 kg/m.     12/21/2022    3:26 PM 12/02/2019   11:42 AM  Advanced Directives  Does Patient Have a Medical Advance Directive? Yes Yes  Type of [place]of [place] will [place] will  Does patient want to make changes to medical advance directive?  No - Patient declined  Copy of [place]ract[place] Chart? No - copy requested No - copy requested    Current Medications (verified) Outpatient Encounter Medications as of 12/21/2022  Medication Sig   atorvastatin (LIPITOR) 10 MG tablet Take 1 tablet by mouth once daily   finasteride (PROSCAR) 5 MG tablet Take 1 tablet by mouth once daily   FLUZONE HIGH-DOSE QUADRIVALENT 0.7 ML SUSY    Multiple Vitamin (MULTIVITAMIN) tablet Take 1 tablet by mouth daily.   SHINGRIX injection    TENIVAC 5-2 LFU injection    No facility-administered encounter medications on file as of 12/21/2022.    Allergies (verified) Patient has no known allergies.   History: Past Medical History:  Diagnosis Date   Bladder neck obstruction    ED (erectile dysfunction)    HLD (hyperlipidemia)    Hydrocele    left   Hypertension    Urinary frequency    Past Surgical History:  Procedure Laterality Date   TONSILLECTOMY AND ADENOIDECTOMY     VASECTOMY     Family History  Problem Relation Age of Onset   Mental illness Mother    COPD Father    Stroke Maternal Grandfather    Cancer Neg Hx        Kidney,Prostate, Bladder   Social History   Socioeconomic History   Marital status: Married     Spouse name: Not on file   Number of children: Not on file   Years of education: Not on file   Highest education level: Not on file  Occupational History   Not on file  Tobacco Use   Smoking status: Never   Smokeless tobacco: Never  Vaping Use   Vaping Use: Never used  Substance and Sexual Activity   Alcohol use: Yes    Alcohol/week: 2.0 standard drinks of alcohol    Types: 2 Glasses of wine per week    Comment: occasionally    Drug use: No   Sexual activity: Not Currently  Other Topics Concern   Not on file  Social History Narrative   Not on file   Social Determinants of Health   Financial Resource Strain: Low Risk  (12/21/2022)   Overall Financial Resource Strain (CARDIA)    Difficulty of Paying Living Expenses: Not hard at all  Food Insecurity: No Food Insecurity (12/21/2022)   Hunger Vital Sign    Worried About Running Out of Food in the Last Year: Never true    Ran Out of Food in the Last Year: Never true  Transportation Needs: No Transportation Needs (12/21/2022)   PRAPARE - THydrologist(Medical): No    Lack of Transportation (  Non-Medical): No  Physical Activity: Insufficiently Active (12/21/2022)   Exercise Vital Sign    Days of Exercise per Week: 2 days    Minutes of Exercise per Session: 60 min  Stress: No Stress Concern Present (12/21/2022)   [place]    Feeling of Stress : Not at all  Social Connections: Moderately Integrated (12/21/2022)   Social Connection and Isolation Panel [NHANES]    Frequency of Communication with Friends and Family: Never    Frequency of Social Gatherings with Friends and Family: More than three times a week    Attends Religious Services: More than 4 times per year    Active Member of [employer] or Organizations: No    Attends Music therapist: Never    Marital Status: Married    Tobacco Counseling Counseling given: Not  Answered   Clinical Intake:  Pre-visit preparation completed: Yes  Pain : No/denies pain     BMI - recorded: 27.22 Nutritional Status: BMI 25 -29 Overweight Diabetes: No  How often do you need to have someone help you when you read instructions, pamphlets, or other written materials from your doctor or pharmacy?: 1 - Never  Diabetic?no  Interpreter Needed?: No  Information entered by :: Charlott Rakes, LPN   Activities of Daily Living    12/21/2022    3:27 PM  In your present state of health, do you have any difficulty performing the following activities:  Hearing? 0  Vision? 0  Difficulty concentrating or making decisions? 0  Walking or climbing stairs? 0  Dressing or bathing? 0  Doing errands, shopping? 0  Preparing Food and eating ? N  Using the Toilet? N  In the past six months, have you accidently leaked urine? Y  Comment wears a pad at times  Do you have problems with loss of bowel control? N  Managing your Medications? N  Managing your Finances? N  Housekeeping or managing your Housekeeping? N    Patient Care Team: Vivi Barrack, MD as PCP - General (Family Medicine) Lorelee Market, MD as Referring Physician (Ophthalmology) Dasher, Rayvon Char, MD (Dermatology) Shon Hough, MD as Consulting Physician (Ophthalmology)  Indicate any recent Medical Services you may have received from other than [facility] providers in the past year (date may be approximate).     Assessment:   This is a routine wellness examination for Trevor Romero.  Hearing/Vision screen Hearing Screening - Comments:: Pt stated slight hearing loss  Vision Screening - Comments:: Pt follows up with Dr Satira Sark for annual eye exams   Dietary issues and exercise activities discussed: Current Exercise Habits: Home exercise routine, Type of exercise: Other - see comments, Time (Minutes): 60, Frequency (Times/Week): 2, Weekly Exercise (Minutes/Week): 120   Goals Addressed             This  Visit's Progress    Patient Stated       Stay active        Depression Screen    12/21/2022    3:25 PM 04/10/2022   10:42 AM 04/08/2021   10:33 AM 12/02/2019   11:43 AM 08/11/2019    1:22 PM 11/29/2018    9:28 AM  PHQ 2/9 Scores  PHQ - 2 Score 0 0 0 0 1 0    Fall Risk    12/21/2022    3:27 PM 04/10/2022   10:42 AM 04/08/2021   10:33 AM 12/02/2019   11:42 AM 08/11/2019    1:24  PM  [place] in the past year? 0 0 0 0 0  Number falls in past yr: 0 0   0  Injury with Fall? 0 0  0 0  Risk for fall due to : Impaired vision No Fall Risks     Follow up Falls prevention discussed   Falls evaluation completed;Education provided;Falls prevention discussed     FALL RISK PREVENTION PERTAINING TO THE HOME:  Any stairs in or around the home? Yes  If so, are there any without handrails? No  Home free of loose throw rugs in walkways, pet beds, electrical cords, etc? Yes  Adequate lighting in your home to reduce risk of falls? Yes   ASSISTIVE DEVICES UTILIZED TO PREVENT FALLS:  Life alert? No  Use of a cane, walker or w/c? No  Grab bars in the bathroom? No  Shower chair or bench in shower? No  Elevated toilet seat or a handicapped toilet? No   TIMED UP AND GO:  Was the test performed? Yes .  Length of time to ambulate 10 feet: 10 sec.   Gait steady and fast without use of assistive device  Cognitive Function:        12/21/2022    3:29 PM 12/02/2019   11:45 AM  6CIT Screen  What Year? 0 points 0 points  What month? 0 points 0 points  What time? 0 points 0 points  Count back from 20 0 points 0 points  Months in reverse 0 points 0 points  Repeat phrase 0 points 0 points  Total Score 0 points 0 points    Immunizations Immunization History  Administered Date(s) Administered   Fluad Quad(high Dose 65+) 08/12/2020, 08/31/2022   Influenza Inj Mdck Quad Pf 09/05/2017   Influenza-Unspecified 08/29/2012, 09/03/2013, 08/21/2014, 08/25/2015, 08/23/2016, 09/05/2017    PFIZER(Purple Top)SARS-COV-2 Vaccination 12/12/2019, 01/02/2020   Pneumococcal Conjugate-13 01/04/2016   Pneumococcal Polysaccharide-23 11/17/2013   Td 08/03/2022   Zoster Recombinat (Shingrix) 08/03/2022, 10/10/2022   Zoster, Live 12/07/2013    TDAP status: Up to date  Flu Vaccine status: Up to date  Pneumococcal vaccine status: Up to date  Covid-19 vaccine status: Completed vaccines  Qualifies for Shingles Vaccine? Yes   Zostavax completed Yes   Shingrix Completed?: Yes  Screening Tests Health Maintenance  Topic Date Due   COLONOSCOPY (Pts 45-56yr Insurance coverage will need to be confirmed)  04/11/2023   Medicare Annual Wellness (AWV)  12/22/2023   DTaP/Tdap/Td (2 - Tdap) 08/03/2032   Pneumonia Vaccine 78 Years old  Completed   INFLUENZA VACCINE  Completed   Hepatitis C Screening  Completed   Zoster Vaccines- Shingrix  Completed   HPV VACCINES  Aged Out   COVID-19 Vaccine  Discontinued    Health Maintenance  There are no preventive care reminders to display for this patient.   Colorectal cancer screening: Type of screening: Colonoscopy. Completed 04/10/18. Repeat every 5 years  Additional Screening:  Hepatitis C Screening: Completed 11/29/18  Vision Screening: Recommended annual ophthalmology exams for early detection of glaucoma and other disorders of the eye. Is the patient up to date with their annual eye exam?  Yes  Who is the provider or what is the name of the office in which the patient attends annual eye exams? Dr TSatira Sark If pt is not established with a provider, would they like to be referred to a provider to establish care? No .   Dental Screening: Recommended annual dental exams for proper oral hygiene  Community Resource Referral / Chronic Care Management: CRR required this visit?  No   CCM required this visit?  No      Plan:     I have personally reviewed and noted the following in the patient's chart:   Medical and social history Use  of alcohol, tobacco or illicit drugs  Current medications and supplements including opioid prescriptions. Patient is not currently taking opioid prescriptions. Functional ability and status Nutritional status Physical activity Advanced directives List of other physicians Hospitalizations, surgeries, and ER visits in previous 12 months Vitals Screenings to include cognitive, depression, and falls Referrals and appointments  In addition, I have reviewed and discussed with patient certain preventive protocols, quality metrics, and best practice recommendations. A written personalized care plan for preventive services as well as general preventive health recommendations were provided to patient.     Willette Brace, LPN   6/0/1561   Nurse Notes: none

## 2022-12-21 NOTE — Patient Instructions (Signed)
Trevor Romero , Thank you for taking time to come for your Medicare Wellness Visit. I appreciate your ongoing commitment to your health goals. Please review the following plan we discussed and let me know if I can assist you in the future.   These are the goals we discussed:  Goals   None     This is a list of the screening recommended for you and due dates:  Health Maintenance  Topic Date Due   Colon Cancer Screening  04/11/2023   Medicare Annual Wellness Visit  12/22/2023   DTaP/Tdap/Td vaccine (2 - Tdap) 08/03/2032   Pneumonia Vaccine  Completed   Flu Shot  Completed   Hepatitis C Screening: USPSTF Recommendation to screen - Ages 18-79 yo.  Completed   Zoster (Shingles) Vaccine  Completed   HPV Vaccine  Aged Out   COVID-19 Vaccine  Discontinued    Advanced directives: Please bring a copy of your health care power of attorney and living will to the office at your convenience.  Conditions/risks identified: stay active   Next appointment: Follow up in one year for your annual wellness visit.   Preventive Care 58 Years and Older, Male  Preventive care refers to lifestyle choices and visits with your health care provider that can promote health and wellness. What does preventive care include? A yearly physical exam. This is also called an annual well check. Dental exams once or twice a year. Routine eye exams. Ask your health care provider how often you should have your eyes checked. Personal lifestyle choices, including: Daily care of your teeth and gums. Regular physical activity. Eating a healthy diet. Avoiding tobacco and drug use. Limiting alcohol use. Practicing safe sex. Taking low doses of aspirin every day. Taking vitamin and mineral supplements as recommended by your health care provider. What happens during an annual well check? The services and screenings done by your health care provider during your annual well check will depend on your age, overall health,  lifestyle risk factors, and family history of disease. Counseling  Your health care provider may ask you questions about your: Alcohol use. Tobacco use. Drug use. Emotional well-being. Home and relationship well-being. Sexual activity. Eating habits. History of falls. Memory and ability to understand (cognition). Work and work Statistician. Screening  You may have the following tests or measurements: Height, weight, and BMI. Blood pressure. Lipid and cholesterol levels. These may be checked every 5 years, or more frequently if you are over 39 years old. Skin check. Lung cancer screening. You may have this screening every year starting at age 9 if you have a 30-pack-year history of smoking and currently smoke or have quit within the past 15 years. Fecal occult blood test (FOBT) of the stool. You may have this test every year starting at age 61. Flexible sigmoidoscopy or colonoscopy. You may have a sigmoidoscopy every 5 years or a colonoscopy every 10 years starting at age 15. Prostate cancer screening. Recommendations will vary depending on your family history and other risks. Hepatitis C blood test. Hepatitis B blood test. Sexually transmitted disease (STD) testing. Diabetes screening. This is done by checking your blood sugar (glucose) after you have not eaten for a while (fasting). You may have this done every 1-3 years. Abdominal aortic aneurysm (AAA) screening. You may need this if you are a current or former smoker. Osteoporosis. You may be screened starting at age 45 if you are at high risk. Talk with your health care provider about your test results,  treatment options, and if necessary, the need for more tests. Vaccines  Your health care provider may recommend certain vaccines, such as: Influenza vaccine. This is recommended every year. Tetanus, diphtheria, and acellular pertussis (Tdap, Td) vaccine. You may need a Td booster every 10 years. Zoster vaccine. You may need this  after age 79. Pneumococcal 13-valent conjugate (PCV13) vaccine. One dose is recommended after age 14. Pneumococcal polysaccharide (PPSV23) vaccine. One dose is recommended after age 34. Talk to your health care provider about which screenings and vaccines you need and how often you need them. This information is not intended to replace advice given to you by your health care provider. Make sure you discuss any questions you have with your health care provider. Document Released: 12/03/2015 Document Revised: 07/26/2016 Document Reviewed: 09/07/2015 Elsevier Interactive Patient Education  2017 [place] Prevention in the Home Falls can cause injuries. They can happen to people of all ages. There are many things you can do to make your home safe and to help prevent falls. What can I do on the outside of my home? Regularly fix the edges of walkways and driveways and fix any cracks. Remove anything that might make you trip as you walk through a door, such as a raised step or threshold. Trim any bushes or trees on the path to your home. Use bright outdoor lighting. Clear any walking paths of anything that might make someone trip, such as rocks or tools. Regularly check to see if handrails are loose or broken. Make sure that both sides of any steps have handrails. Any raised decks and porches should have guardrails on the edges. Have any leaves, snow, or ice cleared regularly. Use sand or salt on walking paths during winter. Clean up any spills in your garage right away. This includes oil or grease spills. What can I do in the bathroom? Use night lights. Install grab bars by the toilet and in the tub and shower. Do not use towel bars as grab bars. Use non-skid mats or decals in the tub or shower. If you need to sit down in the shower, use a plastic, non-slip stool. Keep the floor dry. Clean up any water that spills on the floor as soon as it happens. Remove soap buildup in the tub or  shower regularly. Attach bath mats securely with double-sided non-slip rug tape. Do not have throw rugs and other things on the floor that can make you trip. What can I do in the bedroom? Use night lights. Make sure that you have a light by your bed that is easy to reach. Do not use any sheets or blankets that are too big for your bed. They should not hang down onto the floor. Have a firm chair that has side arms. You can use this for support while you get dressed. Do not have throw rugs and other things on the floor that can make you trip. What can I do in the kitchen? Clean up any spills right away. Avoid walking on wet floors. Keep items that you use a lot in easy-to-reach places. If you need to reach something above you, use a strong step stool that has a grab bar. Keep electrical cords out of the way. Do not use floor polish or wax that makes floors slippery. If you must use wax, use non-skid floor wax. Do not have throw rugs and other things on the floor that can make you trip. What can I do with my stairs? Do  not leave any items on the stairs. Make sure that there are handrails on both sides of the stairs and use them. Fix handrails that are broken or loose. Make sure that handrails are as long as the stairways. Check any carpeting to make sure that it is firmly attached to the stairs. Fix any carpet that is loose or worn. Avoid having throw rugs at the top or bottom of the stairs. If you do have throw rugs, attach them to the floor with carpet tape. Make sure that you have a light switch at the top of the stairs and the bottom of the stairs. If you do not have them, ask someone to add them for you. What else can I do to help prevent falls? Wear shoes that: Do not have high heels. Have rubber bottoms. Are comfortable and fit you well. Are closed at the toe. Do not wear sandals. If you use a stepladder: Make sure that it is fully opened. Do not climb a closed stepladder. Make  sure that both sides of the stepladder are locked into place. Ask someone to hold it for you, if possible. Clearly mark and make sure that you can see: Any grab bars or handrails. First and last steps. Where the edge of each step is. Use tools that help you move around (mobility aids) if they are needed. These include: Canes. Walkers. Scooters. Crutches. Turn on the lights when you go into a dark area. Replace any light bulbs as soon as they burn out. Set up your furniture so you have a clear path. Avoid moving your furniture around. If any of your floors are uneven, fix them. If there are any pets around you, be aware of where they are. Review your medicines with your doctor. Some medicines can make you feel dizzy. This can increase your chance of falling. Ask your doctor what other things that you can do to help prevent falls. This information is not intended to replace advice given to you by your health care provider. Make sure you discuss any questions you have with your health care provider. Document Released: 09/02/2009 Document Revised: 04/13/2016 Document Reviewed: 12/11/2014 Elsevier Interactive Patient Education  2017 [employer].

## 2023-01-11 DIAGNOSIS — D2271 Melanocytic nevi of right lower limb, including hip: Secondary | ICD-10-CM | POA: Diagnosis not present

## 2023-01-11 DIAGNOSIS — Z08 Encounter for follow-up examination after completed treatment for malignant neoplasm: Secondary | ICD-10-CM | POA: Diagnosis not present

## 2023-01-11 DIAGNOSIS — D2261 Melanocytic nevi of right upper limb, including shoulder: Secondary | ICD-10-CM | POA: Diagnosis not present

## 2023-01-11 DIAGNOSIS — Z85828 Personal history of other malignant neoplasm of skin: Secondary | ICD-10-CM | POA: Diagnosis not present

## 2023-01-11 DIAGNOSIS — L300 Nummular dermatitis: Secondary | ICD-10-CM | POA: Diagnosis not present

## 2023-01-11 DIAGNOSIS — D225 Melanocytic nevi of trunk: Secondary | ICD-10-CM | POA: Diagnosis not present

## 2023-01-11 DIAGNOSIS — D2272 Melanocytic nevi of left lower limb, including hip: Secondary | ICD-10-CM | POA: Diagnosis not present

## 2023-01-11 DIAGNOSIS — L821 Other seborrheic keratosis: Secondary | ICD-10-CM | POA: Diagnosis not present

## 2023-01-11 DIAGNOSIS — D2262 Melanocytic nevi of left upper limb, including shoulder: Secondary | ICD-10-CM | POA: Diagnosis not present

## 2023-01-11 DIAGNOSIS — X32XXXA Exposure to sunlight, initial encounter: Secondary | ICD-10-CM | POA: Diagnosis not present

## 2023-01-11 DIAGNOSIS — L570 Actinic keratosis: Secondary | ICD-10-CM | POA: Diagnosis not present

## 2023-01-25 ENCOUNTER — Other Ambulatory Visit: Payer: Self-pay | Admitting: Family Medicine

## 2023-01-25 DIAGNOSIS — N3941 Urge incontinence: Secondary | ICD-10-CM

## 2023-01-29 ENCOUNTER — Other Ambulatory Visit: Payer: Self-pay | Admitting: Family Medicine

## 2023-01-29 ENCOUNTER — Other Ambulatory Visit: Payer: Self-pay | Admitting: *Deleted

## 2023-01-29 DIAGNOSIS — N3941 Urge incontinence: Secondary | ICD-10-CM

## 2023-04-17 ENCOUNTER — Ambulatory Visit (INDEPENDENT_AMBULATORY_CARE_PROVIDER_SITE_OTHER): Payer: Medicare HMO | Admitting: Family Medicine

## 2023-04-17 ENCOUNTER — Encounter: Payer: Self-pay | Admitting: Family Medicine

## 2023-04-17 VITALS — BP 120/78 | HR 57 | Temp 97.5°F | Ht 71.0 in | Wt 192.0 lb

## 2023-04-17 DIAGNOSIS — E782 Mixed hyperlipidemia: Secondary | ICD-10-CM | POA: Diagnosis not present

## 2023-04-17 DIAGNOSIS — Z1211 Encounter for screening for malignant neoplasm of colon: Secondary | ICD-10-CM | POA: Diagnosis not present

## 2023-04-17 DIAGNOSIS — Z131 Encounter for screening for diabetes mellitus: Secondary | ICD-10-CM

## 2023-04-17 DIAGNOSIS — Z0001 Encounter for general adult medical examination with abnormal findings: Secondary | ICD-10-CM

## 2023-04-17 DIAGNOSIS — N401 Enlarged prostate with lower urinary tract symptoms: Secondary | ICD-10-CM

## 2023-04-17 DIAGNOSIS — Z125 Encounter for screening for malignant neoplasm of prostate: Secondary | ICD-10-CM

## 2023-04-17 DIAGNOSIS — R351 Nocturia: Secondary | ICD-10-CM | POA: Diagnosis not present

## 2023-04-17 DIAGNOSIS — N3941 Urge incontinence: Secondary | ICD-10-CM

## 2023-04-17 LAB — CBC
HCT: 43.3 % (ref 39.0–52.0)
Hemoglobin: 14.5 g/dL (ref 13.0–17.0)
MCHC: 33.4 g/dL (ref 30.0–36.0)
MCV: 96 fl (ref 78.0–100.0)
Platelets: 216 10*3/uL (ref 150.0–400.0)
RBC: 4.51 Mil/uL (ref 4.22–5.81)
RDW: 12.7 % (ref 11.5–15.5)
WBC: 6.4 10*3/uL (ref 4.0–10.5)

## 2023-04-17 LAB — COMPREHENSIVE METABOLIC PANEL
ALT: 12 U/L (ref 0–53)
AST: 20 U/L (ref 0–37)
Albumin: 3.7 g/dL (ref 3.5–5.2)
Alkaline Phosphatase: 59 U/L (ref 39–117)
BUN: 19 mg/dL (ref 6–23)
CO2: 31 mEq/L (ref 19–32)
Calcium: 9.1 mg/dL (ref 8.4–10.5)
Chloride: 102 mEq/L (ref 96–112)
Creatinine, Ser: 1.12 mg/dL (ref 0.40–1.50)
GFR: 63.01 mL/min (ref >60.00)
Glucose, Bld: 76 mg/dL (ref 70–99)
Potassium: 4 mEq/L (ref 3.5–5.1)
Sodium: 140 mEq/L (ref 135–145)
Total Bilirubin: 0.5 mg/dL (ref 0.2–1.2)
Total Protein: 6.3 g/dL (ref 6.0–8.3)

## 2023-04-17 LAB — LIPID PANEL
Cholesterol: 131 mg/dL (ref 0–200)
HDL: 39.2 mg/dL (ref >39.00)
LDL Cholesterol: 68 mg/dL (ref 0–99)
NonHDL: 91.37
Total CHOL/HDL Ratio: 3
Triglycerides: 119 mg/dL (ref 0.0–149.0)
VLDL: 23.8 mg/dL (ref 0.0–40.0)

## 2023-04-17 LAB — HEMOGLOBIN A1C: Hgb A1c MFr Bld: 5.9 % (ref 4.6–6.5)

## 2023-04-17 LAB — PSA: PSA: 0.21 ng/mL (ref 0.10–4.00)

## 2023-04-17 LAB — TSH: TSH: 2.61 u[IU]/mL (ref 0.35–5.50)

## 2023-04-17 MED ORDER — FINASTERIDE 5 MG PO TABS: 5.0000 mg | ORAL_TABLET | Freq: Every day | ORAL | 3 refills | Status: DC

## 2023-04-17 NOTE — Assessment & Plan Note (Signed)
Check lipids.  On Lipitor 10 mg daily and tolerating well.

## 2023-04-17 NOTE — Assessment & Plan Note (Signed)
Doing well on Proscar 5 mg daily.  Check PSA.

## 2023-04-17 NOTE — Progress Notes (Signed)
Chief Complaint:  Trevor Romero. is a 77 y.o. male who presents today for his annual comprehensive physical exam.    Assessment/Plan:  Chronic Problems Addressed Today: Hyperlipidemia, mixed Check lipids.  On Lipitor 10 mg daily and tolerating well.  Benign prostatic hyperplasia with lower urinary tract symptoms Doing well on Proscar 5 mg daily.  Check PSA.  Preventative Healthcare: Check labs. UpToDate on vaccines.   Patient Counseling(The following topics were reviewed and/or handout was given):  -Nutrition: Stressed importance of moderation in sodium/caffeine intake, saturated fat and cholesterol, caloric balance, sufficient intake of fresh fruits, vegetables, and fiber.  -Stressed the importance of regular exercise.   -Substance Abuse: Discussed cessation/primary prevention of tobacco, alcohol, or other drug use; driving or other dangerous activities under the influence; availability of treatment for abuse.   -Injury prevention: Discussed safety belts, safety helmets, smoke detector, smoking near bedding or upholstery.   -Sexuality: Discussed sexually transmitted diseases, partner selection, use of condoms, avoidance of unintended pregnancy and contraceptive alternatives.   -Dental health: Discussed importance of regular tooth brushing, flossing, and dental visits.  -Health maintenance and immunizations reviewed. Please refer to Health maintenance section.  Return to care in 1 year for next preventative visit.     Subjective:  HPI:  He has no acute complaints today. See Assessment / plan for status of chronic conditions.   Lifestyle Diet: Balanced. Plenty of fruits and vegetables.  Exercise: Trying to walk consistently.      04/17/2023    8:56 AM  Depression screen PHQ 2/9  Decreased Interest 0  Down, Depressed, Hopeless 0  PHQ - 2 Score 0    Health Maintenance Due  Topic Date Due   Colonoscopy  04/19/2023     ROS: Per HPI, otherwise a complete review of  systems was negative.   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   Hydrocele sac 12/12/2015   Benign prostatic hyperplasia with lower urinary tract symptoms 05/16/2015   Hyperlipidemia, mixed 12/07/2014   Past Surgical History:  Procedure Laterality Date   TONSILLECTOMY AND ADENOIDECTOMY     VASECTOMY      Family History  Problem Relation Age of Onset   Mental illness Mother    COPD Father    Stroke Maternal Grandfather    Cancer Neg Hx        Kidney,Prostate, Bladder    Medications- reviewed and updated Current Outpatient Medications  Medication Sig Dispense Refill   atorvastatin (LIPITOR) 10 MG tablet Take 1 tablet by mouth once daily 90 tablet 0   Multiple Vitamin (MULTIVITAMIN) tablet Take 1 tablet by mouth daily.     finasteride (PROSCAR) 5 MG tablet Take 1 tablet (5 mg total) by mouth daily. 90 tablet 3   No current facility-administered medications for this visit.    Allergies-reviewed and updated No Known Allergies  Social History   Socioeconomic History   Marital status: Married    Spouse name: Not on file   Number of children: Not on file   Years of education: Not on file   Highest education level: Not on file  Occupational History   Not on file  Tobacco Use   Smoking status: Never   Smokeless tobacco: Never  Vaping Use   Vaping Use: Never used  Substance and Sexual Activity   Alcohol use: Yes    Alcohol/week: 2.0 standard drinks of alcohol    Types: 2 Glasses of wine per week    Comment: occasionally    Drug use: No   Sexual activity: Not Currently  Other Topics Concern   Not on file  Social History  Narrative   Not on file   Social Determinants of Health   Financial Resource Strain: Low Risk  (12/21/2022)   Overall Financial Resource Strain (CARDIA)    Difficulty of Paying Living Expenses: Not hard at all  Food Insecurity: No Food Insecurity (12/21/2022)   Hunger Vital Sign    Worried About Running Out of Food in the Last Year: Never true    Ran Out of Food in the Last Year: Never true  Transportation Needs: No Transportation Needs (12/21/2022)   PRAPARE - Administrator, Civil Service (Medical): No    Lack of Transportation (Non-Medical): No  Physical Activity: Insufficiently Active (12/21/2022)   Exercise Vital Sign    Days of Exercise per Week: 2 days    Minutes of Exercise per Session: 60 min  Stress: No Stress Concern Present (12/21/2022)   [employer] of Occupational Health - Occupational Stress Questionnaire    Feeling of Stress : Not at all  Social Connections: Moderately Integrated (12/21/2022)   Social Connection and Isolation Panel [NHANES]    Frequency of Communication with Friends and Family: Never    Frequency of Social Gatherings with Friends and Family: More than three times a week    Attends Religious Services: More than 4 times per year    Active Member of [employer] or Organizations: No    Attends Banker Meetings: Never    Marital Status: Married        Objective:  Physical Exam: BP 120/78   Pulse (!) 57   Temp (!) 97.5 F (36.4 C) (Temporal)   Ht 5\' 11"  (1.803 m)   Wt 192 lb (87.1 kg)   SpO2 98%   BMI 26.78 kg/m   Body mass index is 26.78 kg/m. Wt Readings from Last 3 Encounters:  04/17/23 192 lb (87.1 kg)  12/21/22 195 lb 3.2 oz (88.5 kg)  04/10/22 193 lb (87.5 kg)   Gen: NAD, resting comfortably HEENT: TMs normal bilaterally. OP clear. No thyromegaly noted.  CV: RRR with no murmurs appreciated Pulm: NWOB, CTAB with no crackles, wheezes, or rhonchi GI: Normal bowel sounds present. Soft, Nontender, Nondistended. MSK: no  edema, cyanosis, or clubbing noted Skin: warm, dry Neuro: CN2-12 grossly intact. Strength 5/5 in upper and lower extremities. Reflexes symmetric and intact bilaterally.  Psych: Normal affect and thought content     Magdalene Tardiff M. Jimmey Ralph, MD 04/17/2023 9:23 AM

## 2023-04-17 NOTE — Patient Instructions (Signed)
It was very nice to see you today!  We will check blood work.  I will refer you for your colonoscopy.  Please continue to work on diet and exercise.  Return in about 1 year (around 04/16/2024) for Annual Physical.   Take care, Dr Jimmey Ralph  PLEASE NOTE:  If you had any lab tests, please let us know if you have not heard back within a few days. You may see your results on mychart before we have a chance to review them but we will give you a call once they are reviewed by [place].   If we ordered any referrals today, please let us know if you have not heard from their office within the next week.   If you had any urgent prescriptions sent in today, please check with the pharmacy within an hour of our visit to make sure the prescription was transmitted appropriately.   Please try these tips to maintain a healthy lifestyle:  Eat at least 3 REAL meals and 1-2 snacks per day.  Aim for no more than 5 hours between eating.  If you eat breakfast, please do so within one hour of getting up.   Each meal should contain half fruits/vegetables, one quarter protein, and one quarter carbs (no bigger than a computer mouse)  Cut down on sweet beverages. This includes juice, soda, and sweet tea.   Drink at least 1 glass of water with each meal and aim for at least 8 glasses per day  Exercise at least 150 minutes every week.    Preventive Care 38 Years and Older, Male Preventive care refers to lifestyle choices and visits with your health care provider that can promote health and wellness. Preventive care visits are also called wellness exams. What can I expect for my preventive care visit? Counseling During your preventive care visit, your health care provider may ask about your: Medical history, including: Past medical problems. Family medical history. History of falls. Current health, including: Emotional well-being. Home life and relationship well-being. Sexual activity. Memory and ability to  understand (cognition). Lifestyle, including: Alcohol, nicotine or tobacco, and drug use. Access to firearms. Diet, exercise, and sleep habits. Work and work Astronomer. Sunscreen use. Safety issues such as seatbelt and bike helmet use. Physical exam Your health care provider will check your: Height and weight. These may be used to calculate your BMI (body mass index). BMI is a measurement that tells if you are at a healthy weight. Waist circumference. This measures the distance around your waistline. This measurement also tells if you are at a healthy weight and may help predict your risk of certain diseases, such as type 2 diabetes and high blood pressure. Heart rate and blood pressure. Body temperature. Skin for abnormal spots. What immunizations do I need?  Vaccines are usually given at various ages, according to a schedule. Your health care provider will recommend vaccines for you based on your age, medical history, and lifestyle or other factors, such as travel or where you work. What tests do I need? Screening Your health care provider may recommend screening tests for certain conditions. This may include: Lipid and cholesterol levels. Diabetes screening. This is done by checking your blood sugar (glucose) after you have not eaten for a while (fasting). Hepatitis C test. Hepatitis B test. HIV (human immunodeficiency virus) test. STI (sexually transmitted infection) testing, if you are at risk. Lung cancer screening. Colorectal cancer screening. Prostate cancer screening. Abdominal aortic aneurysm (AAA) screening. You may need this if  you are a current or former smoker. Talk with your health care provider about your test results, treatment options, and if necessary, the need for more tests. Follow these instructions at home: Eating and drinking  Eat a diet that includes fresh fruits and vegetables, whole grains, lean protein, and low-fat dairy products. Limit your intake of  foods with high amounts of sugar, saturated fats, and salt. Take vitamin and mineral supplements as recommended by your health care provider. Do not drink alcohol if your health care provider tells you not to drink. If you drink alcohol: Limit how much you have to 0-2 drinks a day. Know how much alcohol is in your drink. In the [place] one drink equals one 12 oz bottle of beer (355 mL), one 5 oz glass of wine (148 mL), or one 1 oz glass of hard liquor (44 mL). Lifestyle Brush your teeth every morning and night with fluoride toothpaste. Floss one time each day. Exercise for at least 30 minutes 5 or more days each week. Do not use any products that contain nicotine or tobacco. These products include cigarettes, chewing tobacco, and vaping devices, such as e-cigarettes. If you need help quitting, ask your health care provider. Do not use drugs. If you are sexually active, practice safe sex. Use a condom or other form of protection to prevent STIs. Take aspirin only as told by your health care provider. Make sure that you understand how much to take and what form to take. Work with your health care provider to find out whether it is safe and beneficial for you to take aspirin daily. Ask your health care provider if you need to take a cholesterol-lowering medicine (statin). Find healthy ways to manage stress, such as: Meditation, yoga, or listening to music. Journaling. Talking to a trusted person. Spending time with friends and family. Safety Always wear your seat belt while driving or riding in a vehicle. Do not drive: If you have been drinking alcohol. Do not ride with someone who has been drinking. When you are tired or distracted. While texting. If you have been using any mind-altering substances or drugs. Wear a helmet and other protective equipment during sports activities. If you have firearms in your house, make sure you follow all gun safety procedures. Minimize exposure to UV  radiation to reduce your risk of skin cancer. What's next? Visit your health care provider once a year for an annual wellness visit. Ask your health care provider how often you should have your eyes and teeth checked. Stay up to date on all vaccines. This information is not intended to replace advice given to you by your health care provider. Make sure you discuss any questions you have with your health care provider. Document Revised: 05/04/2021 Document Reviewed: 05/04/2021 Elsevier Patient Education  2024 [employer].

## 2023-04-18 NOTE — Progress Notes (Signed)
Great news!  Labs are all stable.  Do not need to make any changes to treatment plan at this time.  He should continue to work on diet and exercise and we can recheck in a year.

## 2023-05-03 ENCOUNTER — Encounter: Payer: Self-pay | Admitting: Gastroenterology

## 2023-05-18 ENCOUNTER — Other Ambulatory Visit: Payer: Self-pay | Admitting: Family Medicine

## 2023-06-21 ENCOUNTER — Encounter: Payer: Self-pay | Admitting: Gastroenterology

## 2023-06-21 ENCOUNTER — Ambulatory Visit: Payer: Medicare HMO | Admitting: Gastroenterology

## 2023-06-21 VITALS — BP 92/60 | HR 72 | Ht 69.5 in | Wt 192.0 lb

## 2023-06-21 DIAGNOSIS — Z86010 Personal history of colonic polyps: Secondary | ICD-10-CM | POA: Diagnosis not present

## 2023-06-21 DIAGNOSIS — Z83719 Family history of colon polyps, unspecified: Secondary | ICD-10-CM

## 2023-06-21 MED ORDER — NA SULFATE-K SULFATE-MG SULF 17.5-3.13-1.6 GM/177ML PO SOLN: 1.0000 | Freq: Once | ORAL | 0 refills | Status: AC

## 2023-06-21 NOTE — Progress Notes (Signed)
Agree with assessment and plan as outlined.  

## 2023-06-21 NOTE — Patient Instructions (Addendum)
You have been scheduled for a colonoscopy. Please follow written instructions given to you at your visit today.   Please pick up your prep supplies at the pharmacy within the next 1-3 days.  If you use inhalers (even only as needed), please bring them with you on the day of your procedure.  DO NOT TAKE 7 DAYS PRIOR TO TEST- Trulicity (dulaglutide) Ozempic, Wegovy (semaglutide) Mounjaro (tirzepatide) Bydureon Bcise (exanatide extended release)  DO NOT TAKE 1 DAY PRIOR TO YOUR TEST Rybelsus (semaglutide) Adlyxin (lixisenatide) Victoza (liraglutide) Byetta (exanatide)  _______________________________________________________  If your blood pressure at your visit was 140/90 or greater, please contact your primary care physician to follow up on this.  _______________________________________________________  If you are age 61 or older, your body mass index should be between 23-30. Your Body mass index is 27.95 kg/m. If this is out of the aforementioned range listed, please consider follow up with your Primary Care Provider.  If you are age 34 or younger, your body mass index should be between 19-25. Your Body mass index is 27.95 kg/m. If this is out of the aformentioned range listed, please consider follow up with your Primary Care Provider.   ________________________________________________________  The [facility] providers would like to encourage you to use [facility] to communicate with providers for non-urgent requests or questions.  Due to long hold times on the telephone, sending your provider a message by [facility] may be a faster and more efficient way to get a response.  Please allow 48 business hours for a response.  Please remember that this is for non-urgent requests.  _______________________________________________________ It was a pleasure to see you today!  Thank you for trusting me with your gastrointestinal care!

## 2023-06-21 NOTE — Progress Notes (Signed)
Chief Complaint: Repeat colonoscopy Primary GI MD: Gentry Fitz  HPI: 77 year old male with medical history listed below presents for evaluation of repeat colonoscopy.  Reports last colonoscopy was in 2019 with Dr. Mechele Collin who is with [facility].  Reports family history of colon polyps in his father.  Patient states he has had 4 colonoscopies in his life and all have shown colon polyps.  Upon review of care everywhere he had a colonoscopy 04/18/2018.  Unable to see report or pathology.  Patient denies GI issues.  Denies change in bowel habits, nausea, vomiting, weight loss, melena, or hematochezia.  Patient is very active and has no heart history.  Patient denies family history of colon cancer.  PREVIOUS GI WORKUP   Colonoscopy 04/18/2018 Unable to see report in Care Everywhere  Colonoscopy in 2014 Unable to see report in Care Everywhere  Past Medical History:  Diagnosis Date   Bladder neck obstruction    ED (erectile dysfunction)    HLD (hyperlipidemia)    Hydrocele    left   Hypertension    Urinary frequency     Past Surgical History:  Procedure Laterality Date   TONSILLECTOMY AND ADENOIDECTOMY     VASECTOMY      Current Outpatient Medications  Medication Sig Dispense Refill   atorvastatin (LIPITOR) 10 MG tablet Take 1 tablet by mouth once daily 90 tablet 0   finasteride (PROSCAR) 5 MG tablet Take 1 tablet (5 mg total) by mouth daily. 90 tablet 3   Multiple Vitamin (MULTIVITAMIN) tablet Take 1 tablet by mouth daily.     No current facility-administered medications for this visit.    Allergies as of 06/21/2023   (No Known Allergies)    Family History  Problem Relation Age of Onset   Mental illness Mother    COPD Father    Colon polyps Father    Stroke Maternal Grandfather    Cancer Neg Hx        Kidney,Prostate, Bladder    Social History   Socioeconomic History   Marital status: Married    Spouse name: Not on file   Number of children: Not on file    Years of education: Not on file   Highest education level: Not on file  Occupational History   Not on file  Tobacco Use   Smoking status: Never   Smokeless tobacco: Never  Vaping Use   Vaping status: Never Used  Substance and Sexual Activity   Alcohol use: Yes    Alcohol/week: 2.0 standard drinks of alcohol    Types: 2 Glasses of wine per week    Comment: occasionally    Drug use: No   Sexual activity: Not Currently  Other Topics Concern   Not on file  Social History Narrative   Not on file   Social Determinants of Health   Financial Resource Strain: Low Risk  (12/21/2022)   Overall Financial Resource Strain (CARDIA)    Difficulty of Paying Living Expenses: Not hard at all  Food Insecurity: No Food Insecurity (12/21/2022)   Hunger Vital Sign    Worried About Running Out of Food in the Last Year: Never true    Ran Out of Food in the Last Year: Never true  Transportation Needs: No Transportation Needs (12/21/2022)   PRAPARE - Administrator, Civil Service (Medical): No    Lack of Transportation (Non-Medical): No  Physical Activity: Insufficiently Active (12/21/2022)   Exercise Vital Sign    Days of Exercise per Week: 2  days    Minutes of Exercise per Session: 60 min  Stress: No Stress Concern Present (12/21/2022)   [employer] of Occupational Health - Occupational Stress Questionnaire    Feeling of Stress : Not at all  Social Connections: Moderately Integrated (12/21/2022)   Social Connection and Isolation Panel [NHANES]    Frequency of Communication with Friends and Family: Never    Frequency of Social Gatherings with Friends and Family: More than three times a week    Attends Religious Services: More than 4 times per year    Active Member of [employer] or Organizations: No    Attends Banker Meetings: Never    Marital Status: Married  Catering manager Violence: Not At Risk (12/21/2022)   Humiliation, Afraid, Rape, and Kick questionnaire    Fear of Current  or Ex-Partner: No    Emotionally Abused: No    Physically Abused: No    Sexually Abused: No    Review of Systems:    Constitutional: No weight loss, fever, chills, weakness or fatigue HEENT: Eyes: No change in vision               Ears, Nose, Throat:  No change in hearing or congestion Skin: No rash or itching Cardiovascular: No chest pain, chest pressure or palpitations   Respiratory: No SOB or cough Gastrointestinal: See HPI and otherwise negative Genitourinary: No dysuria or change in urinary frequency Neurological: No headache, dizziness or syncope Musculoskeletal: No new muscle or joint pain Hematologic: No bleeding or bruising Psychiatric: No history of depression or anxiety    Physical Exam:  Vital signs: Ht 5' 9.5" (1.765 m) Comment: height measured without shoes  Wt 87.1 kg   BMI 27.95 kg/m   Constitutional: NAD, Well developed, Well nourished, alert and cooperative.  Appears significantly younger than stated age Head:  Normocephalic and atraumatic. Eyes:   PEERL, EOMI. No icterus. Conjunctiva pink. Respiratory: Respirations even and unlabored. Lungs clear to auscultation bilaterally.   No wheezes, crackles, or rhonchi.  Cardiovascular:  Regular rate and rhythm. No peripheral edema, cyanosis or pallor. . Rectal:  Not performed.  Msk:  Symmetrical without gross deformities. Without edema, no deformity or joint abnormality.  Neurologic:  Alert and  oriented x4;  grossly normal neurologically.  Skin:   Dry and intact without significant lesions or rashes. Psychiatric: Oriented to person, place and time. Demonstrates good judgement and reason without abnormal affect or behaviors.   RELEVANT LABS AND IMAGING: CBC    Component Value Date/Time   WBC 6.4 04/17/2023 0930   RBC 4.51 04/17/2023 0930   HGB 14.5 04/17/2023 0930   HCT 43.3 04/17/2023 0930   PLT 216.0 04/17/2023 0930   MCV 96.0 04/17/2023 0930   MCHC 33.4 04/17/2023 0930   RDW 12.7 04/17/2023 0930    LYMPHSABS 1.5 04/08/2021 1112   MONOABS 0.5 04/08/2021 1112   EOSABS 0.1 04/08/2021 1112   BASOSABS 0.0 04/08/2021 1112    CMP     Component Value Date/Time   NA 140 04/17/2023 0930   K 4.0 04/17/2023 0930   CL 102 04/17/2023 0930   CO2 31 04/17/2023 0930   GLUCOSE 76 04/17/2023 0930   BUN 19 04/17/2023 0930   CREATININE 1.12 04/17/2023 0930   CALCIUM 9.1 04/17/2023 0930   PROT 6.3 04/17/2023 0930   ALBUMIN 3.7 04/17/2023 0930   AST 20 04/17/2023 0930   ALT 12 04/17/2023 0930   ALKPHOS 59 04/17/2023 0930   BILITOT 0.5 04/17/2023 0930  Assessment/Plan:   History of colon polyps Family history of colon polyps in father Last colonoscopy 03/2018 with repeat of 5 years.  No GI issues at this time.  Patient appears in adequate health for his age and is very active.  Also appears younger than stated age.  I feel he is an adequate candidate for repeat colonoscopy.  Suspect this will be his last screening colonoscopy --- Schedule colonoscopy --- I thoroughly discussed the procedure with the patient (at bedside) to include nature of the procedure, alternatives, benefits, and risks (including but not limited to bleeding, infection, perforation, anesthesia/cardiac pulmonary complications).  Patient verbalized understanding and gave verbal consent to proceed with procedure.    Lara Mulch [facility] 06/21/2023, 2:03 PM  Cc: Ardith Dark, MD

## 2023-07-05 ENCOUNTER — Encounter: Payer: Self-pay | Admitting: Gastroenterology

## 2023-07-07 ENCOUNTER — Encounter: Payer: Self-pay | Admitting: Certified Registered Nurse Anesthetist

## 2023-07-09 ENCOUNTER — Telehealth: Payer: Self-pay | Admitting: Gastroenterology

## 2023-07-09 NOTE — Telephone Encounter (Signed)
PT was told he had to stop taking a certain medication 10 days before procedure 8/26 and cannot remember what it was but he has taken all medications. Please advise.

## 2023-07-09 NOTE — Telephone Encounter (Signed)
Patient states that he took some nyquil due to a "head cold" and wanted to make sure that this is ok prior to his test on 07/16/23. Patient states he has tested and is negative for COVID. I have advised patient that if his symptoms worsen, he develops upper respiratory symptoms or he tests positive between now and procedure appointment, he should reschedule to a later date, however, nyquil consumption will not preclude him from having the procedure.. Patient verbalizes understanding.

## 2023-07-16 ENCOUNTER — Encounter: Payer: Self-pay | Admitting: Gastroenterology

## 2023-07-16 ENCOUNTER — Ambulatory Visit (AMBULATORY_SURGERY_CENTER): Payer: Medicare HMO | Admitting: Gastroenterology

## 2023-07-16 VITALS — BP 118/68 | HR 47 | Temp 97.7°F | Resp 11 | Ht 69.0 in | Wt 192.0 lb

## 2023-07-16 DIAGNOSIS — Z86010 Personal history of colonic polyps: Secondary | ICD-10-CM

## 2023-07-16 DIAGNOSIS — D122 Benign neoplasm of ascending colon: Secondary | ICD-10-CM

## 2023-07-16 DIAGNOSIS — D120 Benign neoplasm of cecum: Secondary | ICD-10-CM

## 2023-07-16 DIAGNOSIS — Z09 Encounter for follow-up examination after completed treatment for conditions other than malignant neoplasm: Secondary | ICD-10-CM | POA: Diagnosis not present

## 2023-07-16 DIAGNOSIS — I10 Essential (primary) hypertension: Secondary | ICD-10-CM | POA: Diagnosis not present

## 2023-07-16 DIAGNOSIS — K635 Polyp of colon: Secondary | ICD-10-CM | POA: Diagnosis not present

## 2023-07-16 MED ORDER — SODIUM CHLORIDE 0.9 % IV SOLN
500.0000 mL | Freq: Once | INTRAVENOUS | Status: DC
Start: 2023-07-16 — End: 2023-07-16

## 2023-07-16 NOTE — Op Note (Signed)
[facility] Patient Name: Trevor Romero Procedure Date: 07/16/2023 11:34 AM MRN: [id number] Endoscopist: Viviann Spare P. Adela Lank , MD, [id number] Age: 77 Referring MD:  Date of Birth: [birth year] Gender: Male Account #: [id number] Procedure:                Colonoscopy Indications:              High risk colon cancer surveillance: Personal                            history of colonic polyps, last exam 03/2018 Medicines:                Monitored Anesthesia Care Procedure:                Pre-Anesthesia Assessment:                           - Prior to the procedure, a History and Physical                            was performed, and patient medications and                            allergies were reviewed. The patient's tolerance of                            previous anesthesia was also reviewed. The risks                            and benefits of the procedure and the sedation                            options and risks were discussed with the patient.                            All questions were answered, and informed consent                            was obtained. Prior Anticoagulants: The patient has                            taken no anticoagulant or antiplatelet agents. ASA                            Grade Assessment: II - A patient with mild systemic                            disease. After reviewing the risks and benefits,                            the patient was deemed in satisfactory condition to                            undergo the procedure.  After obtaining informed consent, the colonoscope                            was passed under direct vision. Throughout the                            procedure, the patient's blood pressure, pulse, and                            oxygen saturations were monitored continuously. The                            Olympus Scope SN [id number] was introduced through the                            anus and  advanced to the the cecum, identified by                            appendiceal orifice and ileocecal valve. The                            colonoscopy was performed without difficulty. The                            patient tolerated the procedure well. The quality                            of the bowel preparation was adequate. The                            ileocecal valve, appendiceal orifice, and rectum                            were photographed. Scope In: 11:45:10 AM Scope Out: 12:07:08 PM Scope Withdrawal Time: 0 hours 17 minutes 1 second  Total Procedure Duration: 0 hours 21 minutes 58 seconds  Findings:                 The perianal and digital rectal examinations were                            normal.                           Multiple small-mouthed diverticula were found in                            the left colon and right colon.                           Two flat and sessile polyps were found in the                            cecum. The polyps were 2 to 4 mm in size. These  polyps were removed with a cold snare. Resection                            and retrieval were complete.                           A 4 mm polyp was found in the ascending colon. The                            polyp was sessile. The polyp was removed with a                            cold snare. Resection and retrieval were complete.                           A 3 mm polyp was found in the sigmoid colon. The                            polyp was sessile. The polyp was removed with a                            cold snare. Resection and retrieval were complete.                           The colon was long and redundant with looping.                           Internal hemorrhoids were found during                            retroflexion. The hemorrhoids were moderate.                           The exam was otherwise without abnormality. Complications:            No immediate  complications. Estimated blood loss:                            Minimal. Estimated Blood Loss:     Estimated blood loss was minimal. Impression:               - Diverticulosis in the left colon and in the right                            colon.                           - Two 2 to 4 mm polyps in the cecum, removed with a                            cold snare. Resected and retrieved.                           - One 4 mm polyp in the ascending colon,  removed                            with a cold snare. Resected and retrieved.                           - One 3 mm polyp in the sigmoid colon, removed with                            a cold snare. Resected and retrieved.                           - Redundant colon.                           - Internal hemorrhoids.                           - The examination was otherwise normal. Recommendation:           - Patient has a contact number available for                            emergencies. The signs and symptoms of potential                            delayed complications were discussed with the                            patient. Return to normal activities tomorrow.                            Written discharge instructions were provided to the                            patient.                           - Resume previous diet.                           - Continue present medications.                           - Await pathology results. Given no high risk                            lesions on this exam, likely no further                            surveillance is needed (likely would not be due                            again until age 17, an age at which most people  stop routine surveillance). Viviann Spare P. Rayvn Rickerson, MD 07/16/2023 12:13:13 PM This report has been signed electronically.

## 2023-07-16 NOTE — Progress Notes (Signed)
Report given to PACU, vss 

## 2023-07-16 NOTE — Patient Instructions (Signed)
   Handouts on polyps,diverticulosis,& hemorrhoids given to you today  Await pathology results on polyps removed      YOU HAD AN ENDOSCOPIC PROCEDURE TODAY AT [facility]:   Refer to the procedure report that was given to you for any specific questions about what was found during the examination.  If the procedure report does not answer your questions, please call your gastroenterologist to clarify.  If you requested that your care partner not be given the details of your procedure findings, then the procedure report has been included in a sealed envelope for you to review at your convenience later.  YOU SHOULD EXPECT: Some feelings of bloating in the abdomen. Passage of more gas than usual.  Walking can help get rid of the air that was put into your GI tract during the procedure and reduce the bloating. If you had a lower endoscopy (such as a colonoscopy or flexible sigmoidoscopy) you may notice spotting of blood in your stool or on the toilet paper. If you underwent a bowel prep for your procedure, you may not have a normal bowel movement for a few days.  Please Note:  You might notice some irritation and congestion in your nose or some drainage.  This is from the oxygen used during your procedure.  There is no need for concern and it should clear up in a day or so.  SYMPTOMS TO REPORT IMMEDIATELY:  Following lower endoscopy (colonoscopy or flexible sigmoidoscopy):  Excessive amounts of blood in the stool  Significant tenderness or worsening of abdominal pains  Swelling of the abdomen that is new, acute  Fever of 100F or higher   For urgent or emergent issues, a gastroenterologist can be reached at any hour by calling [phone]. Do not use MyChart messaging for urgent concerns.    DIET:  We do recommend a small meal at first, but then you may proceed to your regular diet.  Drink plenty of fluids but you should avoid alcoholic beverages for 24 hours.  ACTIVITY:   You should plan to take it easy for the rest of today and you should NOT DRIVE or use heavy machinery until tomorrow (because of the sedation medicines used during the test).    FOLLOW UP: Our staff will call the number listed on your records the next business day following your procedure.  We will call around 7:15- 8:00 am to check on you and address any questions or concerns that you may have regarding the information given to you following your procedure. If we do not reach you, we will leave a message.     If any biopsies were taken you will be contacted by phone or by letter within the next 1-3 weeks.  Please call us at [phone] if you have not heard about the biopsies in 3 weeks.    SIGNATURES/CONFIDENTIALITY: You and/or your care partner have signed paperwork which will be entered into your electronic medical record.  These signatures attest to the fact that that the information above on your After Visit Summary has been reviewed and is understood.  Full responsibility of the confidentiality of this discharge information lies with you and/or your care-partner.

## 2023-07-16 NOTE — Progress Notes (Signed)
[facility] History and Physical   Primary Care Physician:  Ardith Dark, MD   Reason for Procedure:   History of colon polyps  Plan:    colonoscopy     HPI: Trevor Romero. is a 77 y.o. male  here for colonoscopy surveillance - history of polyps on multiple exams in the past, last exam 03/2018 at [facility].   Patient denies any bowel symptoms at this time. No family history of colon cancer known. Otherwise feels well without any cardiopulmonary symptoms.   I have discussed risks / benefits of anesthesia and endoscopic procedure with Sherron Ales. and they wish to proceed with the exams as outlined today.    Past Medical History:  Diagnosis Date   Bladder neck obstruction    ED (erectile dysfunction)    HLD (hyperlipidemia)    Hydrocele    left   Hypertension    Urinary frequency     Past Surgical History:  Procedure Laterality Date   TONSILLECTOMY AND ADENOIDECTOMY     VASECTOMY      Prior to Admission medications   Medication Sig Start Date End Date Taking? Authorizing Provider  atorvastatin (LIPITOR) 10 MG tablet Take 1 tablet by mouth once daily 05/21/23  Yes Ardith Dark, MD  finasteride (PROSCAR) 5 MG tablet Take 1 tablet (5 mg total) by mouth daily. 04/17/23  Yes Ardith Dark, MD  Multiple Vitamin (MULTIVITAMIN) tablet Take 1 tablet by mouth daily.   Yes [provider]    Current Outpatient Medications  Medication Sig Dispense Refill   atorvastatin (LIPITOR) 10 MG tablet Take 1 tablet by mouth once daily 90 tablet 0   finasteride (PROSCAR) 5 MG tablet Take 1 tablet (5 mg total) by mouth daily. 90 tablet 3   Multiple Vitamin (MULTIVITAMIN) tablet Take 1 tablet by mouth daily.     Current Facility-Administered Medications  Medication Dose Route Frequency Provider Last Rate Last Admin   0.9 %  sodium chloride infusion  500 mL Intravenous Once Danamarie Minami, Willaim Rayas, MD        Allergies as of 07/16/2023   (No Known Allergies)     Family History  Problem Relation Age of Onset   Mental illness Mother    COPD Father    Colon polyps Father    Stroke Maternal Grandfather    Cancer Neg Hx        Kidney,Prostate, Bladder    Social History   Socioeconomic History   Marital status: Married    Spouse name: Not on file   Number of children: 4   Years of education: Not on file   Highest education level: Not on file  Occupational History   Occupation: retired  Tobacco Use   Smoking status: Never   Smokeless tobacco: Never  Vaping Use   Vaping status: Never Used  Substance and Sexual Activity   Alcohol use: Yes    Alcohol/week: 2.0 standard drinks of alcohol    Types: 2 Glasses of wine per week    Comment: occasionally    Drug use: No   Sexual activity: Not Currently  Other Topics Concern   Not on file  Social History Narrative   Not on file   Social Determinants of Health   Financial Resource Strain: Low Risk  (12/21/2022)   Overall Financial Resource Strain (CARDIA)    Difficulty of Paying Living Expenses: Not hard at all  Food Insecurity: No Food Insecurity (12/21/2022)   Hunger Vital  Sign    Worried About Programme researcher, broadcasting/film/video in the Last Year: Never true    Ran Out of Food in the Last Year: Never true  Transportation Needs: No Transportation Needs (12/21/2022)   PRAPARE - Administrator, Civil Service (Medical): No    Lack of Transportation (Non-Medical): No  Physical Activity: Insufficiently Active (12/21/2022)   Exercise Vital Sign    Days of Exercise per Week: 2 days    Minutes of Exercise per Session: 60 min  Stress: No Stress Concern Present (12/21/2022)   [employer] of Occupational Health - Occupational Stress Questionnaire    Feeling of Stress : Not at all  Social Connections: Moderately Integrated (12/21/2022)   Social Connection and Isolation Panel [NHANES]    Frequency of Communication with Friends and Family: Never    Frequency of Social Gatherings with Friends and  Family: More than three times a week    Attends Religious Services: More than 4 times per year    Active Member of [employer] or Organizations: No    Attends Banker Meetings: Never    Marital Status: Married  Catering manager Violence: Not At Risk (12/21/2022)   Humiliation, Afraid, Rape, and Kick questionnaire    Fear of Current or Ex-Partner: No    Emotionally Abused: No    Physically Abused: No    Sexually Abused: No    Review of Systems: All other review of systems negative except as mentioned in the HPI.  Physical Exam: Vital signs BP (!) 142/93   Pulse 61   Temp 97.7 F (36.5 C)   Ht 5\' 9"  (1.753 m)   Wt 192 lb (87.1 kg)   SpO2 98%   BMI 28.35 kg/m   General:   Alert,  Well-developed, pleasant and cooperative in NAD Lungs:  Clear throughout to auscultation.   Heart:  Regular rate and rhythm Abdomen:  Soft, nontender and nondistended.   Neuro/Psych:  Alert and cooperative. Normal mood and affect. A and O x 3  Harlin Rain, MD [facility]

## 2023-07-17 ENCOUNTER — Telehealth: Payer: Self-pay

## 2023-07-17 NOTE — Telephone Encounter (Signed)
  Follow up Call-     07/16/2023   11:13 AM  Call back number  Post procedure Call Back phone  # [phone]  Permission to leave phone message Yes     Patient questions:  Do you have a fever, pain , or abdominal swelling? No. Pain Score  0 *  Have you tolerated food without any problems? Yes.    Have you been able to return to your normal activities? Yes.    Do you have any questions about your discharge instructions: Diet   No. Medications  No. Follow up visit  No.  Do you have questions or concerns about your Care? No.  Actions: * If pain score is 4 or above: No action needed, pain <4.

## 2023-08-14 ENCOUNTER — Other Ambulatory Visit: Payer: Self-pay | Admitting: Family Medicine

## 2023-08-20 DIAGNOSIS — H25012 Cortical age-related cataract, left eye: Secondary | ICD-10-CM | POA: Diagnosis not present

## 2023-08-20 DIAGNOSIS — H2512 Age-related nuclear cataract, left eye: Secondary | ICD-10-CM | POA: Diagnosis not present

## 2023-08-20 DIAGNOSIS — H52203 Unspecified astigmatism, bilateral: Secondary | ICD-10-CM | POA: Diagnosis not present

## 2023-08-20 DIAGNOSIS — H43813 Vitreous degeneration, bilateral: Secondary | ICD-10-CM | POA: Diagnosis not present

## 2023-08-20 DIAGNOSIS — H524 Presbyopia: Secondary | ICD-10-CM | POA: Diagnosis not present

## 2023-08-20 DIAGNOSIS — H02403 Unspecified ptosis of bilateral eyelids: Secondary | ICD-10-CM | POA: Diagnosis not present

## 2023-08-20 DIAGNOSIS — H26491 Other secondary cataract, right eye: Secondary | ICD-10-CM | POA: Diagnosis not present

## 2023-09-11 DIAGNOSIS — H26491 Other secondary cataract, right eye: Secondary | ICD-10-CM | POA: Diagnosis not present

## 2023-09-20 ENCOUNTER — Encounter: Payer: Self-pay | Admitting: Pharmacist

## 2023-09-20 NOTE — Progress Notes (Signed)
Pharmacy Quality Measure Review  This patient is appearing on a report for being at risk of failing the adherence measure for cholesterol (statin) medications this calendar year.   Medication: atorvastatin 10mg  Last fill date: 72 for 05/21/2023 day supply  Reviewed Dr Tiajuana Amass database - actual last RF was 08/15/2023 for 90 DS  Insurance report was not up to date. No action needed at this time.   Henrene Pastor, PharmD Clinical Pharmacist [facility]  Population Health [phone]

## 2023-11-16 ENCOUNTER — Other Ambulatory Visit: Payer: Self-pay | Admitting: Family Medicine

## 2023-12-07 ENCOUNTER — Encounter: Payer: Self-pay | Admitting: Family Medicine

## 2023-12-07 ENCOUNTER — Ambulatory Visit (INDEPENDENT_AMBULATORY_CARE_PROVIDER_SITE_OTHER): Payer: Medicare HMO | Admitting: Family Medicine

## 2023-12-07 VITALS — BP 149/81 | HR 61 | Temp 98.0°F | Ht 69.0 in

## 2023-12-07 DIAGNOSIS — M25552 Pain in left hip: Secondary | ICD-10-CM

## 2023-12-07 DIAGNOSIS — R030 Elevated blood-pressure reading, without diagnosis of hypertension: Secondary | ICD-10-CM

## 2023-12-07 DIAGNOSIS — E782 Mixed hyperlipidemia: Secondary | ICD-10-CM

## 2023-12-07 MED ORDER — MELOXICAM 15 MG PO TABS: 15.0000 mg | ORAL_TABLET | Freq: Every day | ORAL | 0 refills | Status: DC

## 2023-12-07 NOTE — Progress Notes (Signed)
   Trevor Romero. is a 78 y.o. male who presents today for an office visit.  Assessment/Plan:  New/Acute Problems: Left Hip Pain Exam and history consistent with trochanteric bursitis. We discussed home exercise program and handout was given. May have mild IT band syndrome as well.  Will also start meloxicam 15 mg daily for a couple of weeks.  He will let us know if not improving in the next couple of weeks and we consider referral to PT or sports medicine at that time.  Chronic Problems Addressed Today: Elevated blood pressure reading At goal per JNC 8. He will continue to monitor at home and let us know if persistently elevated.  Hyperlipidemia, mixed Doing well on Lipitor 10 mg daily.  He will come back soon for CPE and we can check lipids at that time.     Subjective:  HPI:  See Assessment / plan for status of chronic conditions. He is here today with left hip pain. This has been going on for several months. Worse with motions and certain activities. Worse with flexing the area. No injuries or precipitating events. No treatments tried. Worked out on elipical which did fine. Pain located on left out thigh. Sometime goes into knee.  Hurts to lay on left side as well.         Objective:  Physical Exam: BP (!) 149/81   Pulse 61   Temp 98 F (36.7 C) (Temporal)   Ht 5\' 9"  (1.753 m)   SpO2 99%   BMI 28.35 kg/m   Gen: No acute distress, resting comfortably MUSCULOSKELETAL: -Left leg without deformities.  Tender to palpation along greater trochanter.  Neurovascular intact distally.  Pain elicited with internal and external rotation.  Slight pain with resisted hip flexion. Neuro: Grossly normal, moves all extremities Psych: Normal affect and thought content      Trevor Romero M. Jimmey Ralph, MD 12/07/2023 11:05 AM

## 2023-12-07 NOTE — Assessment & Plan Note (Signed)
At goal per JNC 8. He will continue to monitor at home and let us know if persistently elevated.

## 2023-12-07 NOTE — Patient Instructions (Signed)
It was very nice to see you today!  I think you have bursitis.  Please work on the exercises.  Take meloxicam.  Let us know if not improving in the next couple of weeks.  Return if symptoms worsen or fail to improve.   Take care, Dr Jimmey Ralph  PLEASE NOTE:  If you had any lab tests, please let us know if you have not heard back within a few days. You may see your results on mychart before we have a chance to review them but we will give you a call once they are reviewed by [place].   If we ordered any referrals today, please let us know if you have not heard from their office within the next week.   If you had any urgent prescriptions sent in today, please check with the pharmacy within an hour of our visit to make sure the prescription was transmitted appropriately.   Please try these tips to maintain a healthy lifestyle:  Eat at least 3 REAL meals and 1-2 snacks per day.  Aim for no more than 5 hours between eating.  If you eat breakfast, please do so within one hour of getting up.   Each meal should contain half fruits/vegetables, one quarter protein, and one quarter carbs (no bigger than a computer mouse)  Cut down on sweet beverages. This includes juice, soda, and sweet tea.   Drink at least 1 glass of water with each meal and aim for at least 8 glasses per day  Exercise at least 150 minutes every week.

## 2023-12-07 NOTE — Assessment & Plan Note (Signed)
Doing well on Lipitor 10 mg daily.  He will come back soon for CPE and we can check lipids at that time.

## 2024-01-14 DIAGNOSIS — Z85828 Personal history of other malignant neoplasm of skin: Secondary | ICD-10-CM | POA: Diagnosis not present

## 2024-01-14 DIAGNOSIS — D225 Melanocytic nevi of trunk: Secondary | ICD-10-CM | POA: Diagnosis not present

## 2024-01-14 DIAGNOSIS — D2261 Melanocytic nevi of right upper limb, including shoulder: Secondary | ICD-10-CM | POA: Diagnosis not present

## 2024-01-14 DIAGNOSIS — L570 Actinic keratosis: Secondary | ICD-10-CM | POA: Diagnosis not present

## 2024-01-14 DIAGNOSIS — D2272 Melanocytic nevi of left lower limb, including hip: Secondary | ICD-10-CM | POA: Diagnosis not present

## 2024-01-14 DIAGNOSIS — D2262 Melanocytic nevi of left upper limb, including shoulder: Secondary | ICD-10-CM | POA: Diagnosis not present

## 2024-01-16 ENCOUNTER — Ambulatory Visit: Payer: Self-pay | Admitting: Family Medicine

## 2024-01-16 NOTE — Telephone Encounter (Signed)
 Noted  Patient has an OV on 01/17/2023 with PCP

## 2024-01-16 NOTE — Telephone Encounter (Signed)
 Copied from CRM [id number]. Topic: Clinical - Red Word Triage >> Jan 16, 2024 11:36 AM Fonda Kinder J wrote: Red Word that prompted transfer to Nurse Triage: Worsening hip pain  Chief Complaint: left hip pain Symptoms: pain with standing, sitting, dull pain that varies in intensity but has becomesevere Frequency: early 10/2023 Pertinent Negatives: Patient denies swelling or redness or radiating pain Disposition: [] ED /[] Urgent Care (no appt availability in office) / [x] Appointment(In office/virtual)/ []  [facility]/ [] Home Care/ [] Refused Recommended Disposition /[] [facility]/ []  Follow-up with PCP Additional Notes:   Reason for Disposition  [1] MODERATE pain (e.g., interferes with normal activities, limping) AND [2] present > 3 days  Answer Assessment - Initial Assessment Questions 1. LOCATION and RADIATION: "Where is the pain located?"      Left hip  2. QUALITY: "What does the pain feel like?"  (e.g., sharp, dull, aching, burning)     dull 3. SEVERITY: "How bad is the pain?" "What does it keep you from doing?"   (Scale 1-10; or mild, moderate, severe)   -  MILD (1-3): doesn't interfere with normal activities    -  MODERATE (4-7): interferes with normal activities (e.g., work or school) or awakens from sleep, limping    -  SEVERE (8-10): excruciating pain, unable to do any normal activities, unable to walk     Varies 8/10 2/10 5/10 4. ONSET: "When did the pain start?" "Does it come and go, or is it there all the time?"    Dec 2024- comes and goes 5. WORK OR EXERCISE: "Has there been any recent work or exercise that involved this part of the body?"      exercise 6. CAUSE: "What do you think is causing the hip pain?"      no 7. AGGRAVATING FACTORS: "What makes the hip pain worse?" (e.g., walking, climbing stairs, running)     Standing, sitting 8. OTHER SYMPTOMS: "Do you have any other symptoms?" (e.g., back pain, pain shooting down leg,  fever, rash)      no  Protocols used: Hip Pain-A-AH

## 2024-01-18 ENCOUNTER — Ambulatory Visit: Payer: Medicare HMO | Admitting: Family Medicine

## 2024-01-22 ENCOUNTER — Ambulatory Visit: Payer: Medicare HMO | Admitting: Family Medicine

## 2024-01-22 ENCOUNTER — Encounter: Payer: Self-pay | Admitting: Family Medicine

## 2024-01-22 VITALS — BP 137/73 | HR 69 | Temp 97.2°F | Ht 69.0 in | Wt 195.2 lb

## 2024-01-22 DIAGNOSIS — R030 Elevated blood-pressure reading, without diagnosis of hypertension: Secondary | ICD-10-CM

## 2024-01-22 DIAGNOSIS — M25552 Pain in left hip: Secondary | ICD-10-CM | POA: Diagnosis not present

## 2024-01-22 DIAGNOSIS — E782 Mixed hyperlipidemia: Secondary | ICD-10-CM | POA: Diagnosis not present

## 2024-01-22 MED ORDER — MELOXICAM 15 MG PO TABS: 15.0000 mg | ORAL_TABLET | Freq: Every day | ORAL | 0 refills | Status: AC

## 2024-01-22 MED ORDER — METHYLPREDNISOLONE ACETATE 80 MG/ML IJ SUSP
80.0000 mg | Freq: Once | INTRAMUSCULAR | Status: AC
Start: 2024-01-22 — End: 2024-01-22
  Administered 2024-01-22: 80 mg via INTRA_ARTICULAR

## 2024-01-22 NOTE — Patient Instructions (Signed)
 It was very nice to see you today!  We injected your hip today with cortisone.  Please continue to work on the exercises at home.  Let us know if not improving and we can refer you to see sports medicine.  Return if symptoms worsen or fail to improve.   Take care, Dr Jimmey Ralph  PLEASE NOTE:  If you had any lab tests, please let us know if you have not heard back within a few days. You may see your results on mychart before we have a chance to review them but we will give you a call once they are reviewed by [place].   If we ordered any referrals today, please let us know if you have not heard from their office within the next week.   If you had any urgent prescriptions sent in today, please check with the pharmacy within an hour of our visit to make sure the prescription was transmitted appropriately.   Please try these tips to maintain a healthy lifestyle:  Eat at least 3 REAL meals and 1-2 snacks per day.  Aim for no more than 5 hours between eating.  If you eat breakfast, please do so within one hour of getting up.   Each meal should contain half fruits/vegetables, one quarter protein, and one quarter carbs (no bigger than a computer mouse)  Cut down on sweet beverages. This includes juice, soda, and sweet tea.   Drink at least 1 glass of water with each meal and aim for at least 8 glasses per day  Exercise at least 150 minutes every week.

## 2024-01-22 NOTE — Assessment & Plan Note (Signed)
 At goal today.  Better controlled than last time.  He will monitor at home and let us know if persistently elevated.

## 2024-01-22 NOTE — Assessment & Plan Note (Signed)
 Stable on Lipitor 10 mg daily.  Check lipids at upcoming CPE.

## 2024-01-22 NOTE — Progress Notes (Signed)
   Trevor Romero Trevor Romero. is a 78 y.o. male who presents today for an office visit.  Assessment/Plan:  New/Acute Problems: Left Hip Pain  Likely due to trochanteric bursitis.  He had only modest improvement with meloxicam and home exercise program that we discussed at his last office visit.  We did discuss alternative treatment options including referral to sports medicine/PT or trial of steroid injection.  He elected to pursue injection today.  This was performed today.  See below procedure note.  He tolerated well.  Will refill his meloxicam as well.  He can continue home exercises as well.  He will let us know if not improving in the next couple of weeks and we can refer to sports medicine if needed.  Chronic Problems Addressed Today: Elevated blood pressure reading At goal today.  Better controlled than last time.  He will monitor at home and let us know if persistently elevated.  Hyperlipidemia, mixed Stable on Lipitor 10 mg daily.  Check lipids at upcoming CPE.     Subjective:  HPI:  Patient here with left hip pain.  We last saw him for this about 6 weeks ago.  At that time we started him on meloxicam.  This did work for [employer] however pain has since returned. HE tried working on some home exercise without much improvement. He also tried ibuprofen with some improvement. Overall symptoms have worsened since our last visit.        Objective:  Physical Exam: BP 137/73   Pulse 69   Temp (!) 97.2 F (36.2 C) (Temporal)   Ht 5\' 9"  (1.753 m)   Wt 195 lb 3.2 oz (88.5 kg)   SpO2 98%   BMI 28.83 kg/m   Gen: No acute distress, resting comfortably MUSCULOSKELETAL: - Left leg without deformities.  Tender to palpation along greater trochanter.  Neurovascular intact distally. Neuro: Grossly normal, moves all extremities Psych: Normal affect and thought content  Bursa Aspiration without Injection Procedure Note  Pre-operative Diagnosis: left Trochanteric bursitis  Post-operative  Diagnosis: same  Indications: Diagnosis and treatment of symptomatic bursal effusion  Procedure Details   After a discussion of the risks and benefits with the patient (including the possibility that any manipulation of the bursa could introduce infection, worsening the current situation significantly), verbal consent was obtained for the procedure. The area was prepped with Betadine.  Topical ethyl chloride was applied for anesthesia.  A 3-1 mixture of 1% lidocaine without epinephrine and 80 mg/cc of Depo-Medrol was then injected into the point of maximal tenderness in a circumferential fashion. The injection site was cleansed with topical isopropyl alcohol and a dressing was applied.  Complications:  None; patient tolerated the procedure well.       Trevor Romero. Jimmey Ralph, MD 01/22/2024 2:48 PM

## 2024-02-04 ENCOUNTER — Encounter: Payer: Self-pay | Admitting: Family Medicine

## 2024-02-04 ENCOUNTER — Other Ambulatory Visit: Payer: Self-pay | Admitting: *Deleted

## 2024-02-04 DIAGNOSIS — M25559 Pain in unspecified hip: Secondary | ICD-10-CM

## 2024-02-04 NOTE — Telephone Encounter (Signed)
 Ok to refer to sports medicine.  Trevor Romero. Jimmey Ralph, MD 02/04/2024 12:00 PM

## 2024-02-04 NOTE — Telephone Encounter (Signed)
**Note De-identified  [place] Obfuscation** Please advise 

## 2024-02-05 NOTE — Progress Notes (Unsigned)
    Aleen Sells D[facility] [address] [place] [address] Phone: [phone]   Assessment and Plan:     There are no diagnoses linked to this encounter.  ***   Pertinent previous records reviewed include ***    Follow Up: ***     Subjective:   I, Smantha Boakye, am serving as a Neurosurgeon for Doctor Richardean Sale  Chief Complaint: left hip pain   HPI:   02/06/2024 Patient is a 78 year old male with left hip pain. Patient states   Relevant Historical Information: ***  Additional pertinent review of systems negative.   Current Outpatient Medications:    atorvastatin (LIPITOR) 10 MG tablet, Take 1 tablet by mouth once daily, Disp: 90 tablet, Rfl: 0   finasteride (PROSCAR) 5 MG tablet, Take 1 tablet (5 mg total) by mouth daily., Disp: 90 tablet, Rfl: 3   meloxicam (MOBIC) 15 MG tablet, Take 1 tablet (15 mg total) by mouth daily., Disp: 30 tablet, Rfl: 0   Multiple Vitamin (MULTIVITAMIN) tablet, Take 1 tablet by mouth daily., Disp: , Rfl:    Objective:     There were no vitals filed for this visit.    There is no height or weight on file to calculate BMI.    Physical Exam:    ***   Electronically signed by:  Aleen Sells [facility] 7:33 AM 02/05/24

## 2024-02-06 ENCOUNTER — Ambulatory Visit (INDEPENDENT_AMBULATORY_CARE_PROVIDER_SITE_OTHER)

## 2024-02-06 ENCOUNTER — Ambulatory Visit: Admitting: Sports Medicine

## 2024-02-06 ENCOUNTER — Other Ambulatory Visit: Payer: Self-pay

## 2024-02-06 VITALS — BP 138/86 | HR 61 | Ht 69.0 in | Wt 195.0 lb

## 2024-02-06 DIAGNOSIS — M25552 Pain in left hip: Secondary | ICD-10-CM

## 2024-02-06 DIAGNOSIS — M1612 Unilateral primary osteoarthritis, left hip: Secondary | ICD-10-CM

## 2024-02-06 DIAGNOSIS — M47816 Spondylosis without myelopathy or radiculopathy, lumbar region: Secondary | ICD-10-CM | POA: Diagnosis not present

## 2024-02-06 NOTE — Patient Instructions (Signed)
 Tylenol [phone] mg 2-3 times a day for pain relief  NSAIDs such as ibuprofen and meloxicam as needed for breakthrough pain limit to 1-2 times per week  Hip HEP  3-4 week follow up

## 2024-02-11 ENCOUNTER — Other Ambulatory Visit: Payer: Self-pay | Admitting: Family Medicine

## 2024-02-20 ENCOUNTER — Encounter: Payer: Self-pay | Admitting: Sports Medicine

## 2024-02-26 ENCOUNTER — Ambulatory Visit (INDEPENDENT_AMBULATORY_CARE_PROVIDER_SITE_OTHER): Payer: Medicare HMO

## 2024-02-26 VITALS — BP 110/62 | HR 75 | Temp 98.6°F | Ht 69.0 in | Wt 191.2 lb

## 2024-02-26 DIAGNOSIS — Z0000 Encounter for general adult medical examination without abnormal findings: Secondary | ICD-10-CM | POA: Diagnosis not present

## 2024-02-26 NOTE — Progress Notes (Unsigned)
    Trevor Romero D[facility] [address] [place] [address] Phone: [phone]   Assessment and Plan:     There are no diagnoses linked to this encounter.  ***   Pertinent previous records reviewed include ***    Follow Up: ***     Subjective:   I, Trevor Romero, am serving as a Neurosurgeon for Doctor Richardean Sale   Chief Complaint: left hip pain    HPI:    02/06/2024 Patient is a 78 year old male with left hip pain. Patient states pain started before christmas. Pain when standing, laying in bed. He is active but the pain limits his action. Was taking meloxicam and cocktail only lasted for a week. Pain radiates to his thigh and knee. No numbness or tingling. No MOI. Decreased ROM. Pain is intermittent on severity. Ice helps a little.    02/27/2024 Patient states   Relevant Historical Information: None pertinent  Additional pertinent review of systems negative.   Current Outpatient Medications:    atorvastatin (LIPITOR) 10 MG tablet, Take 1 tablet by mouth once daily, Disp: 90 tablet, Rfl: 0   finasteride (PROSCAR) 5 MG tablet, Take 1 tablet (5 mg total) by mouth daily., Disp: 90 tablet, Rfl: 3   meloxicam (MOBIC) 15 MG tablet, Take 1 tablet (15 mg total) by mouth daily., Disp: 30 tablet, Rfl: 0   Multiple Vitamin (MULTIVITAMIN) tablet, Take 1 tablet by mouth daily., Disp: , Rfl:    Objective:     There were no vitals filed for this visit.    There is no height or weight on file to calculate BMI.    Physical Exam:    ***   Electronically signed by:  Trevor Romero [facility] 7:35 AM 02/26/24

## 2024-02-26 NOTE — Patient Instructions (Signed)
 Trevor Romero , Thank you for taking time to come for your Medicare Wellness Visit. I appreciate your ongoing commitment to your health goals. Please review the following plan we discussed and let me know if I can assist you in the future.   Referrals/Orders/Follow-Ups/Clinician Recommendations: Aim for 30 minutes of exercise or brisk walking, 6-8 glasses of water, and 5 servings of fruits and vegetables each day.   This is a list of the screening recommended for you and due dates:  Health Maintenance  Topic Date Due   Flu Shot  06/20/2024   Medicare Annual Wellness Visit  02/25/2025   DTaP/Tdap/Td vaccine (2 - Tdap) 08/03/2032   Pneumonia Vaccine  Completed   Hepatitis C Screening  Completed   Zoster (Shingles) Vaccine  Completed   HPV Vaccine  Aged Out   Colon Cancer Screening  Discontinued   COVID-19 Vaccine  Discontinued    Advanced directives: (Copy Requested) Please bring a copy of your health care power of attorney and living will to the office to be added to your chart at your convenience. You can mail to [facility] [address] [place], [place] [address] or email to [email or web address]  Next Medicare Annual Wellness Visit scheduled for next year: Yes

## 2024-02-26 NOTE — Progress Notes (Signed)
 Subjective:   Trevor Wholey. is a 78 y.o. who presents for a Medicare Wellness preventive visit.  Visit Complete: In person    Persons Participating in Visit: Patient.  AWV Questionnaire: Yes: Patient Medicare AWV questionnaire was completed by the patient on 02/25/24; I have confirmed that all information answered by patient is correct and no changes since this date.  Cardiac Risk Factors include: advanced age (>65men, >39 women);dyslipidemia;male gender     Objective:    Today's Vitals   02/25/24 2010 02/26/24 1420  BP:  110/62  Pulse:  75  Temp:  98.6 F (37 C)  SpO2:  94%  Weight:  191 lb 3.2 oz (86.7 kg)  Height:  5\' 9"  (1.753 m)  PainSc: 3     Body mass index is 28.24 kg/m.     02/26/2024    2:31 PM 12/21/2022    3:26 PM 12/02/2019   11:42 AM  Advanced Directives  Does Patient Have a Medical Advance Directive? Yes Yes Yes  Type of Estate agent of [place];Living will Healthcare Power of [place];Living will Healthcare Power of [place];Living will  Does patient want to make changes to medical advance directive?   No - Patient declined  Copy of Healthcare Power of Attorney in Chart? No - copy requested No - copy requested No - copy requested    Current Medications (verified) Outpatient Encounter Medications as of 02/26/2024  Medication Sig   atorvastatin (LIPITOR) 10 MG tablet Take 1 tablet by mouth once daily   finasteride (PROSCAR) 5 MG tablet Take 1 tablet (5 mg total) by mouth daily.   FLUZONE HIGH-DOSE 0.5 ML injection    meloxicam (MOBIC) 15 MG tablet Take 1 tablet (15 mg total) by mouth daily.   Multiple Vitamin (MULTIVITAMIN) tablet Take 1 tablet by mouth daily.   No facility-administered encounter medications on file as of 02/26/2024.    Allergies (verified) Patient has no known allergies.   History: Past Medical History:  Diagnosis Date   Bladder neck obstruction    ED (erectile dysfunction)    HLD (hyperlipidemia)     Hydrocele    left   Hypertension    Urinary frequency    Past Surgical History:  Procedure Laterality Date   TONSILLECTOMY AND ADENOIDECTOMY     VASECTOMY     Family History  Problem Relation Age of Onset   Mental illness Mother    COPD Father    Colon polyps Father    Stroke Maternal Grandfather    Cancer Neg Hx        Kidney,Prostate, Bladder   Social History   Socioeconomic History   Marital status: Married    Spouse name: Not on file   Number of children: 4   Years of education: Not on file   Highest education level: Bachelor's degree (e.g., BA, AB, BS)  Occupational History   Occupation: retired  Tobacco Use   Smoking status: Never   Smokeless tobacco: Never  Vaping Use   Vaping status: Never Used  Substance and Sexual Activity   Alcohol use: Yes    Alcohol/week: 2.0 standard drinks of alcohol    Types: 2 Glasses of wine per week    Comment: occasionally    Drug use: No   Sexual activity: Not Currently  Other Topics Concern   Not on file  Social History Narrative   Not on file   Social Drivers of Health   Financial Resource Strain: Low Risk  (02/26/2024)  Overall Financial Resource Strain (CARDIA)    Difficulty of Paying Living Expenses: Not hard at all  Food Insecurity: No Food Insecurity (02/26/2024)   Hunger Vital Sign    Worried About Running Out of Food in the Last Year: Never true    Ran Out of Food in the Last Year: Never true  Transportation Needs: No Transportation Needs (02/26/2024)   PRAPARE - Administrator, Civil Service (Medical): No    Lack of Transportation (Non-Medical): No  Physical Activity: Insufficiently Active (02/26/2024)   Exercise Vital Sign    Days of Exercise per Week: 3 days    Minutes of Exercise per Session: 30 min  Stress: No Stress Concern Present (02/26/2024)   [employer] of Occupational Health - Occupational Stress Questionnaire    Feeling of Stress : Not at all  Social Connections: Moderately Integrated  (02/26/2024)   Social Connection and Isolation Panel [NHANES]    Frequency of Communication with Friends and Family: More than three times a week    Frequency of Social Gatherings with Friends and Family: More than three times a week    Attends Religious Services: More than 4 times per year    Active Member of [employer] or Organizations: No    Attends Engineer, structural: Never    Marital Status: Married    Tobacco Counseling Counseling given: Not Answered    Clinical Intake:  Pre-visit preparation completed: Yes  Pain : 0-10 Pain Score: 3  Pain Type: Acute pain Pain Location: Hip Pain Orientation: Left Pain Descriptors / Indicators: Aching Pain Onset: More than a month ago Pain Frequency: Intermittent     BMI - recorded: 28.24 Nutritional Status: BMI 25 -29 Overweight  Lab Results  Component Value Date   HGBA1C 5.9 04/17/2023   HGBA1C 5.8 04/10/2022     How often do you need to have someone help you when you read instructions, pamphlets, or other written materials from your doctor or pharmacy?: 1 - Never  Interpreter Needed?: No  Information entered by :: Lanier Ensign, LPN   Activities of Daily Living     02/25/2024    8:10 PM  In your present state of health, do you have any difficulty performing the following activities:  Hearing? 1  Comment hearing aids  Vision? 0  Difficulty concentrating or making decisions? 0  Walking or climbing stairs? 0  Dressing or bathing? 0  Doing errands, shopping? 0  Preparing Food and eating ? N  Using the Toilet? N  In the past six months, have you accidently leaked urine? Y  Comment wears a brief  Do you have problems with loss of bowel control? N  Managing your Medications? N  Managing your Finances? N  Housekeeping or managing your Housekeeping? N    Patient Care Team: Ardith Dark, MD as PCP - General (Family Medicine) Hinda Kehr, MD as Referring Physician (Ophthalmology) Dasher, Cliffton Asters, MD  (Dermatology) Mckinley Jewel, MD as Consulting Physician (Ophthalmology)  Indicate any recent Medical Services you may have received from other than [facility] providers in the past year (date may be approximate).     Assessment:   This is a routine wellness examination for Trevor Romero.  Hearing/Vision screen Hearing Screening - Comments:: Pt wears hearing aids  Vision Screening - Comments:: Wears rx glasses - up to date with routine eye exams with Dr Burgess Estelle     Goals Addressed  This Visit's Progress    Patient Stated       Lose weight and consistent exeercise        Depression Screen     02/26/2024    2:31 PM 01/22/2024    2:24 PM 12/07/2023   10:19 AM 04/17/2023    8:56 AM 12/21/2022    3:25 PM 04/10/2022   10:42 AM 04/08/2021   10:33 AM  PHQ 2/9 Scores  PHQ - 2 Score 0 0 0 0 0 0 0  PHQ- 9 Score 0 0 3        Fall Risk     02/25/2024    8:10 PM 01/22/2024    2:25 PM 12/07/2023   10:20 AM 04/17/2023    8:56 AM 12/21/2022    3:27 PM  Fall Risk   Falls in the past year? 0 0 0 0 0  Number falls in past yr: 0 0 0 0 0  Injury with Fall?   0 0 0  Risk for fall due to : No Fall Risks No Fall Risks No Fall Risks No Fall Risks Impaired vision  Follow up Falls prevention discussed    Falls prevention discussed    MEDICARE RISK AT HOME:  Medicare Risk at Home Any stairs in or around the home?: (Patient-Rptd) Yes If so, are there any without handrails?: (Patient-Rptd) No Home free of loose throw rugs in walkways, pet beds, electrical cords, etc?: (Patient-Rptd) Yes Adequate lighting in your home to reduce risk of falls?: (Patient-Rptd) Yes Life alert?: (Patient-Rptd) No Use of a cane, walker or w/c?: (Patient-Rptd) No Grab bars in the bathroom?: (Patient-Rptd) No Shower chair or bench in shower?: (Patient-Rptd) No Elevated toilet seat or a handicapped toilet?: (Patient-Rptd) No  TIMED UP AND GO:  Was the test performed?  Yes  Length of time to ambulate 10 feet: 10  sec Gait steady and fast without use of assistive device  Cognitive Function: 6CIT completed        02/26/2024    2:33 PM 12/21/2022    3:29 PM 12/02/2019   11:45 AM  6CIT Screen  What Year? 0 points 0 points 0 points  What month? 0 points 0 points 0 points  What time? 0 points 0 points 0 points  Count back from 20 0 points 0 points 0 points  Months in reverse 0 points 0 points 0 points  Repeat phrase 0 points 0 points 0 points  Total Score 0 points 0 points 0 points    Immunizations Immunization History  Administered Date(s) Administered   Fluad Quad(high Dose 65+) 08/12/2020, 08/31/2022   Influenza Inj Mdck Quad Pf 08/23/2016, 09/05/2017   Influenza, High Dose Seasonal PF 09/13/2023   Influenza-Unspecified 08/29/2012, 09/03/2013, 08/21/2014, 08/25/2015, 08/23/2016, 09/05/2017, 08/31/2022   PFIZER(Purple Top)SARS-COV-2 Vaccination 12/12/2019, 01/02/2020, 09/14/2020   Pneumococcal Conjugate-13 01/04/2016   Pneumococcal Polysaccharide-23 11/17/2013   Pneumococcal-Unspecified 01/04/2016   Td 08/03/2022   Zoster Recombinant(Shingrix) 08/03/2022, 10/10/2022   Zoster, Live 11/18/2013, 12/07/2013    Screening Tests Health Maintenance  Topic Date Due   INFLUENZA VACCINE  06/20/2024   Medicare Annual Wellness (AWV)  02/25/2025   DTaP/Tdap/Td (2 - Tdap) 08/03/2032   Pneumonia Vaccine 6+ Years old  Completed   Hepatitis C Screening  Completed   Zoster Vaccines- Shingrix  Completed   HPV VACCINES  Aged Out   Colonoscopy  Discontinued   COVID-19 Vaccine  Discontinued    Health Maintenance  There are no preventive care reminders to display for this  patient.  Health Maintenance Items Addressed: See Nurse Notes  Additional Screening:  Vision Screening: Recommended annual ophthalmology exams for early detection of glaucoma and other disorders of the eye.  Dental Screening: Recommended annual dental exams for proper oral hygiene  Community Resource Referral / Chronic Care  Management: CRR required this visit?  No   CCM required this visit?  No     Plan:     I have personally reviewed and noted the following in the patient's chart:   Medical and social history Use of alcohol, tobacco or illicit drugs  Current medications and supplements including opioid prescriptions. Patient is not currently taking opioid prescriptions. Functional ability and status Nutritional status Physical activity Advanced directives List of other physicians Hospitalizations, surgeries, and ER visits in previous 12 months Vitals Screenings to include cognitive, depression, and falls Referrals and appointments  In addition, I have reviewed and discussed with patient certain preventive protocols, quality metrics, and best practice recommendations. A written personalized care plan for preventive services as well as general preventive health recommendations were provided to patient.     Marzella Schlein, LPN   11/25/1094   After Visit Summary: (MyChart) Due to this being a telephonic visit, the after visit summary with patients personalized plan was offered to patient via MyChart   Notes: Nothing significant to report at this time.

## 2024-02-27 ENCOUNTER — Ambulatory Visit: Admitting: Sports Medicine

## 2024-02-27 VITALS — BP 120/84 | HR 59 | Ht 69.0 in | Wt 191.0 lb

## 2024-02-27 DIAGNOSIS — M25552 Pain in left hip: Secondary | ICD-10-CM | POA: Diagnosis not present

## 2024-02-27 DIAGNOSIS — M1612 Unilateral primary osteoarthritis, left hip: Secondary | ICD-10-CM | POA: Diagnosis not present

## 2024-02-27 NOTE — Patient Instructions (Signed)
 Tylenol [phone] mg 2-3 times a day for pain relief  Ibuprofen  or meloxicam as needed for breakthrough pain limit to 1-2 times per weeks  Continue HEP  PT referral  8 week follow up, or sooner if pain flares

## 2024-03-12 NOTE — Therapy (Unsigned)
 OUTPATIENT PHYSICAL THERAPY LOWER EXTREMITY EVALUATION   Patient Name: Trevor Romero. MRN: [id number] DOB:01/05/1946, 78 y.o., male Today's Date: 03/13/2024  END OF SESSION:  PT End of Session - 03/13/24 1349     Visit Number 1    Number of Visits 12    Date for PT Re-Evaluation 06/05/24    PT Start Time 1350    PT Stop Time 1428    PT Time Calculation (min) 38 min    Activity Tolerance Patient tolerated treatment well    Behavior During Therapy WFL for tasks assessed/performed             Past Medical History:  Diagnosis Date   Bladder neck obstruction    ED (erectile dysfunction)    HLD (hyperlipidemia)    Hydrocele    left   Hypertension    Urinary frequency    Past Surgical History:  Procedure Laterality Date   TONSILLECTOMY AND ADENOIDECTOMY     VASECTOMY     Patient Active Problem List   Diagnosis Date Noted   Elevated blood pressure reading 03/05/2019   SCC (squamous cell carcinoma) 11/29/2018   Hydrocele sac 12/12/2015   Benign prostatic hyperplasia with lower urinary tract symptoms 05/16/2015   Hyperlipidemia, mixed 12/07/2014    PCP: Rodney Clamp MD  REFERRING PROVIDER: Ulysees Gander, DO  REFERRING DIAG: [phone] (ICD-10-CM) - Primary osteoarthritis of left hip  THERAPY DIAG:  Pain in left hip  Muscle weakness (generalized)  Rationale for Evaluation and Treatment: Rehabilitation  ONSET DATE: Around christmas 2024  SUBJECTIVE:   SUBJECTIVE STATEMENT: States he had an injection in his hip. States pain comes and goes and keeps him up at night. First they thought it was bursitis now they think it is arthritis. States that they have been doing stretching exercises. Mowing the grass and going for a walk aggravates his symptoms. Any time he has to walk he has to go slowly.        PERTINENT HISTORY: HTN,  PAIN:  Are you having pain? Yes: NPRS scale: 8/10 Pain location: left hip latera and left knee l  Pain description: dull   Aggravating factors: mowing, walking Relieving factors: rest, medication   PRECAUTIONS: None  RED FLAGS: None   WEIGHT BEARING RESTRICTIONS: No  FALLS:  Has patient fallen in last 6 months? No   OCCUPATION: not currently working.  PLOF: Independent  PATIENT GOALS: to have less pain    OBJECTIVE:  Note: Objective measures were completed at Evaluation unless otherwise noted.  DIAGNOSTIC FINDINGS: Right hip xray  02/06/24 FINDINGS: There is no evidence of hip fracture or dislocation of the left hip. No acute displaced fracture or dislocation of the right hip on frontal view. No acute displaced fracture or diastasis of the bones of the pelvis. Degenerative changes of the lower lumbar spine. There is no evidence of severe arthropathy or other focal bone abnormality.   IMPRESSION: Negative for acute traumatic injury.  PATIENT SURVEYS:  Patient-specific activity functional scoring scheme (Point to one number):  "0" represents "unable to perform." "10" represents "able to perform at prior level. 0 1 2 3 4 5 6 7 8 9  10 (Date and Score) Activity Initial  Activity Eval     Mowing the yard ( after)  5    Going for a walk (after) 5    Standing in one place  5    Additional Additional Total score = sum of the activity scores/number of activities Minimum detectable  change (90%CI) for average score = 2 points Minimum detectable change (90%CI) for single activity score = 3 points PSFS developed by: [name], & [name], J. (1995). Assessing disability and change on individual  patients: a report of a patient specific measure. Physiotherapy [place], 47, [phone]. Reproduced with the permission of the authors  Score: 5   COGNITION: Overall cognitive status: Within functional limits for tasks assessed     SENSATION: Not tested  EDEMA:  none    POSTURE: rounded shoulders, forward head, posterior pelvic tilt, and flexed trunk    PALPATION: Tenderness to palpation and increased resting tone noted along left glutes and pirifor   lumbar AROM:    EVAL     Flexion  WFL     Extension  100% limited*     R ROT       L ROT       R SB  50% limited    L SB 50% limited *     * Pain   (Blank rows = not tested)   LE Measurements Lower Extremity Right EVAL Left EVAL   A/PROM MMT A/PROM MMT  Hip Flexion 105  105   Hip Extension  3+  3+  Hip Abduction      Hip Adduction      Hip Internal rotation 45  30   Hip External rotation 45  40   Knee Flexion      Knee Extension      Ankle Dorsiflexion      Ankle Plantarflexion      Ankle Inversion      Ankle Eversion       (Blank rows = not tested) * pain   LOWER EXTREMITY SPECIAL TESTS:  + scour B + Ely's B                                                                                                                                 TREATMENT DATE:   03/13/2024  Therapeutic Exercise:  Reviewed initial HEP  Supine: Prone: lying 3 minutes, POE x10, prone extension x10 5" holds hamstring curls x10 B 5" holds - cramping initially  Seated:  Standing: Neuromuscular Re-education: Manual Therapy:IASTM with percussion gun to left hip - tolerated well - less pain noted Therapeutic Activity: Self Care: Trigger Point Dry Needling:  Modalities:    PATIENT EDUCATION:  Education details: on current presentation, on HEP, on clinical outcomes score and POC, educated patient in safe use of percussion gun with towel and how to use Person educated: Patient Education method: Programmer, multimedia, Facilities manager, and Handouts Education comprehension: verbalized understanding   HOME EXERCISE PROGRAM: [id number]  ASSESSMENT:  CLINICAL IMPRESSION: Patient presents to physical therapy with complaints of left hip pain that started last year around Christmas.  Patient presents with limitations in range of motion in hip and back as well as strength deficits that are likely  contributing to current  presentation.  Session focused on education and development of home exercise program.  Patient would greatly benefit from skilled PT to improve overall function and quality of life.  OBJECTIVE IMPAIRMENTS: Abnormal gait, decreased activity tolerance, improper body mechanics, postural dysfunction, and pain.   ACTIVITY LIMITATIONS: sitting, standing, sleeping, and locomotion level  PARTICIPATION LIMITATIONS: cleaning, community activity, and yard work  PERSONAL FACTORS: Age, Fitness, and Time since onset of injury/illness/exacerbation are also affecting patient's functional outcome.   REHAB POTENTIAL: Good  CLINICAL DECISION MAKING: Stable/uncomplicated  EVALUATION COMPLEXITY: Low   GOALS: Goals reviewed with patient? yes  SHORT TERM GOALS: Target date: 04/24/2024   Patient will be independent in self management strategies to improve quality of life and functional outcomes. Baseline: New Program Goal status: INITIAL  2.  Patient will report at least 50% improvement in overall symptoms and/or function to demonstrate improved functional mobility Baseline: 0% better Goal status: INITIAL  3.  Patient will demonstrate pain-free lumbar range of motion in all directions Baseline: Painful Goal status: INITIAL       LONG TERM GOALS: Target date: 06/05/2024    Patient will report at least 75% improvement in overall symptoms and/or function to demonstrate improved functional mobility Baseline: 0% better Goal status: INITIAL  2.  Patient will score at least 2 points higher on PSFS average to demonstrate change in overall function. Baseline: see above Goal status: INITIAL  3.  Patient will be able to go for walk without increased pain during or afterwards to improve walking tolerance Baseline: Painful Goal status: INITIAL       PLAN:  PT FREQUENCY: 1x/week  PT DURATION: 12 weeks  PLANNED INTERVENTIONS: 97110-Therapeutic exercises, 97530- Therapeutic  activity, 97112- Neuromuscular re-education, 97535- Self Care, [id number]- Manual therapy, [phone]- Gait training, [phone]- Orthotic Fit/training, [phone]- Canalith repositioning, [id number]- Aquatic Therapy, [id number]- Electrical stimulation (unattended), [phone]- Ionotophoresis 4mg /ml Dexamethasone, Patient/Family education, Balance training, Stair training, Taping, Dry Needling, Joint mobilization, Joint manipulation, Spinal manipulation, Spinal mobilization, Cryotherapy, and Moist heat   PLAN FOR NEXT SESSION: lumbar ROM, decompression, Hip ROM, hip MMT, percussion gun   4:10 PM, 03/13/24 Tabitha Ewings, DPT Physical Therapy [facility]

## 2024-03-13 ENCOUNTER — Encounter: Payer: Self-pay | Admitting: Physical Therapy

## 2024-03-13 ENCOUNTER — Ambulatory Visit: Admitting: Physical Therapy

## 2024-03-13 DIAGNOSIS — M6281 Muscle weakness (generalized): Secondary | ICD-10-CM | POA: Diagnosis not present

## 2024-03-13 DIAGNOSIS — M25552 Pain in left hip: Secondary | ICD-10-CM

## 2024-03-27 ENCOUNTER — Ambulatory Visit: Admitting: Physical Therapy

## 2024-03-27 ENCOUNTER — Encounter: Payer: Self-pay | Admitting: Physical Therapy

## 2024-03-27 DIAGNOSIS — M25552 Pain in left hip: Secondary | ICD-10-CM | POA: Diagnosis not present

## 2024-03-27 DIAGNOSIS — M6281 Muscle weakness (generalized): Secondary | ICD-10-CM | POA: Diagnosis not present

## 2024-03-27 NOTE — Therapy (Signed)
 OUTPATIENT PHYSICAL THERAPY LOWER EXTREMITY TREATMENT   Patient Name: Trevor Romero. MRN: [id number] DOB:12-Jun-1946, 78 y.o., male Today's Date: 03/27/2024  END OF SESSION:  PT End of Session - 03/27/24 1303     Visit Number 2    Number of Visits 12    Date for PT Re-Evaluation 06/05/24    PT Start Time 1305    PT Stop Time 1343    PT Time Calculation (min) 38 min    Activity Tolerance Patient tolerated treatment well    Behavior During Therapy WFL for tasks assessed/performed              Past Medical History:  Diagnosis Date   Bladder neck obstruction    ED (erectile dysfunction)    HLD (hyperlipidemia)    Hydrocele    left   Hypertension    Urinary frequency    Past Surgical History:  Procedure Laterality Date   TONSILLECTOMY AND ADENOIDECTOMY     VASECTOMY     Patient Active Problem List   Diagnosis Date Noted   Elevated blood pressure reading 03/05/2019   SCC (squamous cell carcinoma) 11/29/2018   Hydrocele sac 12/12/2015   Benign prostatic hyperplasia with lower urinary tract symptoms 05/16/2015   Hyperlipidemia, mixed 12/07/2014    PCP: Rodney Clamp MD  REFERRING PROVIDER: Ulysees Gander, DO  REFERRING DIAG: [phone] (ICD-10-CM) - Primary osteoarthritis of left hip  THERAPY DIAG:  Pain in left hip  Muscle weakness (generalized)  Rationale for Evaluation and Treatment: Rehabilitation  ONSET DATE: Around christmas 2024  SUBJECTIVE:   SUBJECTIVE STATEMENT: 03/27/2024 States he doesn't have a lot of energy. States his pain is not as bad. Exercises feel good. States he was out of town but still tried to do his exercises.   EVAL: States he had an injection in his hip. States pain comes and goes and keeps him up at night. First they thought it was bursitis now they think it is arthritis. States that they have been doing stretching exercises. Mowing the grass and going for a walk aggravates his symptoms. Any time he has to walk he has to go  slowly.        PERTINENT HISTORY: HTN,  PAIN:  Are you having pain? Yes: NPRS scale: 1/10 Pain location: left hip latera and left knee l  Pain description: dull  Aggravating factors: mowing, walking Relieving factors: rest, medication   PRECAUTIONS: None  RED FLAGS: None   WEIGHT BEARING RESTRICTIONS: No  FALLS:  Has patient fallen in last 6 months? No   OCCUPATION: not currently working.  PLOF: Independent  PATIENT GOALS: to have less pain    OBJECTIVE:  Note: Objective measures were completed at Evaluation unless otherwise noted.  DIAGNOSTIC FINDINGS: Right hip xray  02/06/24 FINDINGS: There is no evidence of hip fracture or dislocation of the left hip. No acute displaced fracture or dislocation of the right hip on frontal view. No acute displaced fracture or diastasis of the bones of the pelvis. Degenerative changes of the lower lumbar spine. There is no evidence of severe arthropathy or other focal bone abnormality.   IMPRESSION: Negative for acute traumatic injury.  PATIENT SURVEYS:  Patient-specific activity functional scoring scheme (Point to one number):  "0" represents "unable to perform." "10" represents "able to perform at prior level. 0 1 2 3 4 5 6 7 8 9  10 (Date and Score) Activity Initial  Activity Eval     Mowing the yard ( after)  5    Going for a walk (after) 5    Standing in one place  5    Additional Additional Total score = sum of the activity scores/number of activities Minimum detectable change (90%CI) for average score = 2 points Minimum detectable change (90%CI) for single activity score = 3 points PSFS developed by: [name], & [name], J. (1995). Assessing disability and change on individual  patients: a report of a patient specific measure. Physiotherapy [place], 47, [phone]. Reproduced with the permission of the authors  Score: 5   COGNITION: Overall cognitive status: Within functional  limits for tasks assessed     SENSATION: Not tested  EDEMA:  none    POSTURE: rounded shoulders, forward head, posterior pelvic tilt, and flexed trunk   PALPATION: Tenderness to palpation and increased resting tone noted along left glutes and pirifor   lumbar AROM:    EVAL     Flexion  WFL     Extension  100% limited*     R ROT       L ROT       R SB  50% limited    L SB 50% limited *     * Pain   (Blank rows = not tested)   LE Measurements Lower Extremity Right EVAL Left EVAL   A/PROM MMT A/PROM MMT  Hip Flexion 105  105   Hip Extension  3+  3+  Hip Abduction      Hip Adduction      Hip Internal rotation 45  30   Hip External rotation 45  40   Knee Flexion      Knee Extension      Ankle Dorsiflexion      Ankle Plantarflexion      Ankle Inversion      Ankle Eversion       (Blank rows = not tested) * pain   LOWER EXTREMITY SPECIAL TESTS:  + scour B + Ely's B                                                                                                                                 TREATMENT DATE:   03/27/2024  Therapeutic Exercise:  Reviewed initial HEP  Supine:LTR 2 min, hip IR 2 minutes, bent knee fall outs 2 min, piriformis x3 30" holds B, bridge with posterior tuck 3x10  Prone:  hamstring curls x10 B, hip IR 2 minutes B  Seated:  Standing: Neuromuscular Re-education: Manual Therapy:IASTM with percussion gun to left hip - tolerated well - less pain noted Therapeutic Activity: Self Care: Trigger Point Dry Needling:  Modalities:    PATIENT EDUCATION:  Education details: on HEP Person educated: Patient Education method: Explanation, Facilities manager, and Handouts Education comprehension: verbalized understanding   HOME EXERCISE PROGRAM: [id number]  ASSESSMENT:  CLINICAL IMPRESSION: 03/27/2024 Able to progress all exercises. Slight discomfort initially with bent knee fall outs. This  resolved after manual interventions. Instructed patient to  perform massage prior to exercises and to stay in pain free ROM. Will continue to progress exercises as able.   EVAL: Patient presents to physical therapy with complaints of left hip pain that started last year around Christmas.  Patient presents with limitations in range of motion in hip and back as well as strength deficits that are likely contributing to current presentation.  Session focused on education and development of home exercise program.  Patient would greatly benefit from skilled PT to improve overall function and quality of life.  OBJECTIVE IMPAIRMENTS: Abnormal gait, decreased activity tolerance, improper body mechanics, postural dysfunction, and pain.   ACTIVITY LIMITATIONS: sitting, standing, sleeping, and locomotion level  PARTICIPATION LIMITATIONS: cleaning, community activity, and yard work  PERSONAL FACTORS: Age, Fitness, and Time since onset of injury/illness/exacerbation are also affecting patient's functional outcome.   REHAB POTENTIAL: Good  CLINICAL DECISION MAKING: Stable/uncomplicated  EVALUATION COMPLEXITY: Low   GOALS: Goals reviewed with patient? yes  SHORT TERM GOALS: Target date: 04/24/2024   Patient will be independent in self management strategies to improve quality of life and functional outcomes. Baseline: New Program Goal status: INITIAL  2.  Patient will report at least 50% improvement in overall symptoms and/or function to demonstrate improved functional mobility Baseline: 0% better Goal status: INITIAL  3.  Patient will demonstrate pain-free lumbar range of motion in all directions Baseline: Painful Goal status: INITIAL       LONG TERM GOALS: Target date: 06/05/2024    Patient will report at least 75% improvement in overall symptoms and/or function to demonstrate improved functional mobility Baseline: 0% better Goal status: INITIAL  2.  Patient will score at least 2 points higher on PSFS average to demonstrate change in overall  function. Baseline: see above Goal status: INITIAL  3.  Patient will be able to go for walk without increased pain during or afterwards to improve walking tolerance Baseline: Painful Goal status: INITIAL       PLAN:  PT FREQUENCY: 1x/week  PT DURATION: 12 weeks  PLANNED INTERVENTIONS: 97110-Therapeutic exercises, 97530- Therapeutic activity, 97112- Neuromuscular re-education, 97535- Self Care, 40981- Manual therapy, [phone]- Gait training, [phone]- Orthotic Fit/training, [phone]- Canalith repositioning, [id number]- Aquatic Therapy, 97014- Electrical stimulation (unattended), [phone]- Ionotophoresis 4mg /ml Dexamethasone, Patient/Family education, Balance training, Stair training, Taping, Dry Needling, Joint mobilization, Joint manipulation, Spinal manipulation, Spinal mobilization, Cryotherapy, and Moist heat   PLAN FOR NEXT SESSION: lumbar ROM, decompression, Hip ROM, hip MMT, percussion gun   1:44 PM, 03/27/24 Tabitha Ewings, DPT Physical Therapy with [facility]

## 2024-04-03 ENCOUNTER — Encounter: Payer: Self-pay | Admitting: Physical Therapy

## 2024-04-03 ENCOUNTER — Ambulatory Visit: Admitting: Physical Therapy

## 2024-04-03 DIAGNOSIS — M25552 Pain in left hip: Secondary | ICD-10-CM | POA: Diagnosis not present

## 2024-04-03 DIAGNOSIS — M6281 Muscle weakness (generalized): Secondary | ICD-10-CM | POA: Diagnosis not present

## 2024-04-03 NOTE — Therapy (Signed)
 OUTPATIENT PHYSICAL THERAPY LOWER EXTREMITY TREATMENT   Patient Name: Trevor Romero. MRN: [id number] DOB:05-21-1946, 78 y.o., male Today's Date: 04/03/2024  END OF SESSION:  PT End of Session - 04/03/24 1209     Visit Number 3    Number of Visits 12    Date for PT Re-Evaluation 06/05/24    PT Start Time 1213    PT Stop Time 1252    PT Time Calculation (min) 39 min    Activity Tolerance Patient tolerated treatment well    Behavior During Therapy WFL for tasks assessed/performed              Past Medical History:  Diagnosis Date   Bladder neck obstruction    ED (erectile dysfunction)    HLD (hyperlipidemia)    Hydrocele    left   Hypertension    Urinary frequency    Past Surgical History:  Procedure Laterality Date   TONSILLECTOMY AND ADENOIDECTOMY     VASECTOMY     Patient Active Problem List   Diagnosis Date Noted   Elevated blood pressure reading 03/05/2019   SCC (squamous cell carcinoma) 11/29/2018   Hydrocele sac 12/12/2015   Benign prostatic hyperplasia with lower urinary tract symptoms 05/16/2015   Hyperlipidemia, mixed 12/07/2014    PCP: Rodney Clamp MD  REFERRING PROVIDER: Ulysees Gander, DO  REFERRING DIAG: [phone] (ICD-10-CM) - Primary osteoarthritis of left hip  THERAPY DIAG:  Pain in left hip  Muscle weakness (generalized)  Rationale for Evaluation and Treatment: Rehabilitation  ONSET DATE: Around christmas 2024  SUBJECTIVE:   SUBJECTIVE STATEMENT: 04/03/2024 States he doesn't have any pain. States an awareness of his hip. States his energy is better. States he just came from the gym.   EVAL: States he had an injection in his hip. States pain comes and goes and keeps him up at night. First they thought it was bursitis now they think it is arthritis. States that they have been doing stretching exercises. Mowing the grass and going for a walk aggravates his symptoms. Any time he has to walk he has to go slowly.         PERTINENT HISTORY: HTN,  PAIN:  Are you having pain? Yes: NPRS scale: 1/10 Pain location: left hip lateral Pain description: awareness of hip  Aggravating factors: mowing, walking Relieving factors: rest, medication   PRECAUTIONS: None  RED FLAGS: None   WEIGHT BEARING RESTRICTIONS: No  FALLS:  Has patient fallen in last 6 months? No   OCCUPATION: not currently working.  PLOF: Independent  PATIENT GOALS: to have less pain    OBJECTIVE:  Note: Objective measures were completed at Evaluation unless otherwise noted.  DIAGNOSTIC FINDINGS: Right hip xray  02/06/24 FINDINGS: There is no evidence of hip fracture or dislocation of the left hip. No acute displaced fracture or dislocation of the right hip on frontal view. No acute displaced fracture or diastasis of the bones of the pelvis. Degenerative changes of the lower lumbar spine. There is no evidence of severe arthropathy or other focal bone abnormality.   IMPRESSION: Negative for acute traumatic injury.  PATIENT SURVEYS:  Patient-specific activity functional scoring scheme (Point to one number):  "0" represents "unable to perform." "10" represents "able to perform at prior level. 0 1 2 3 4 5 6 7 8 9  10 (Date and Score) Activity Initial  Activity Eval     Mowing the yard ( after)  5    Going for a walk (after) 5  Standing in one place  5    Additional Additional Total score = sum of the activity scores/number of activities Minimum detectable change (90%CI) for average score = 2 points Minimum detectable change (90%CI) for single activity score = 3 points PSFS developed by: [name], & [name], J. (1995). Assessing disability and change on individual  patients: a report of a patient specific measure. Physiotherapy [place], 47, [phone]. Reproduced with the permission of the authors  Score: 5   COGNITION: Overall cognitive status: Within functional limits for tasks  assessed     SENSATION: Not tested  EDEMA:  none    POSTURE: rounded shoulders, forward head, posterior pelvic tilt, and flexed trunk   PALPATION: Tenderness to palpation and increased resting tone noted along left glutes and pirifor   lumbar AROM:    EVAL     Flexion  WFL     Extension  100% limited*     R ROT       L ROT       R SB  50% limited    L SB 50% limited *     * Pain   (Blank rows = not tested)   LE Measurements Lower Extremity Right EVAL Left EVAL   A/PROM MMT A/PROM MMT  Hip Flexion 105  105   Hip Extension  3+  3+  Hip Abduction      Hip Adduction      Hip Internal rotation 45  30   Hip External rotation 45  40   Knee Flexion      Knee Extension      Ankle Dorsiflexion      Ankle Plantarflexion      Ankle Inversion      Ankle Eversion       (Blank rows = not tested) * pain   LOWER EXTREMITY SPECIAL TESTS:  + scour B + Ely's B                                                                                                                                 TREATMENT DATE:   04/03/2024  Therapeutic Exercise:  Reviewed initial HEP  Supine:LTR on ball 2 min, hip IR 2 minutes, bent knee fall outs 2 min, SLR 4x6 B, abdominal press into ball x15 5" holds knees supported on table  Prone:    Seated: hamstring stretch x3 20" holds  Standing: tall at wall x3 60" holds, HIP ABD 3X10 B, hip ext 3x10 B Neuromuscular Re-education: Manual Therapy: Therapeutic Activity: Self Care: Trigger Point Dry Needling:  Modalities:    PATIENT EDUCATION:  Education details: on HEP Person educated: Patient Education method: Explanation, Facilities manager, and Handouts Education comprehension: verbalized understanding   HOME EXERCISE PROGRAM: [id number]  ASSESSMENT:  CLINICAL IMPRESSION: 04/03/2024 Progressed exercises as able. Left leg started to get sore with single leg activities. Transitioned to supine interventions which were tolerated better. Added  standing exercise to HEP but instructed patient ot only perform if pain free. No increase in symptoms noted end of session. Will continue with current POC as tolerated.   EVAL: Patient presents to physical therapy with complaints of left hip pain that started last year around Christmas.  Patient presents with limitations in range of motion in hip and back as well as strength deficits that are likely contributing to current presentation.  Session focused on education and development of home exercise program.  Patient would greatly benefit from skilled PT to improve overall function and quality of life.  OBJECTIVE IMPAIRMENTS: Abnormal gait, decreased activity tolerance, improper body mechanics, postural dysfunction, and pain.   ACTIVITY LIMITATIONS: sitting, standing, sleeping, and locomotion level  PARTICIPATION LIMITATIONS: cleaning, community activity, and yard work  PERSONAL FACTORS: Age, Fitness, and Time since onset of injury/illness/exacerbation are also affecting patient's functional outcome.   REHAB POTENTIAL: Good  CLINICAL DECISION MAKING: Stable/uncomplicated  EVALUATION COMPLEXITY: Low   GOALS: Goals reviewed with patient? yes  SHORT TERM GOALS: Target date: 04/24/2024   Patient will be independent in self management strategies to improve quality of life and functional outcomes. Baseline: New Program Goal status: INITIAL  2.  Patient will report at least 50% improvement in overall symptoms and/or function to demonstrate improved functional mobility Baseline: 0% better Goal status: INITIAL  3.  Patient will demonstrate pain-free lumbar range of motion in all directions Baseline: Painful Goal status: INITIAL       LONG TERM GOALS: Target date: 06/05/2024    Patient will report at least 75% improvement in overall symptoms and/or function to demonstrate improved functional mobility Baseline: 0% better Goal status: INITIAL  2.  Patient will score at least 2 points  higher on PSFS average to demonstrate change in overall function. Baseline: see above Goal status: INITIAL  3.  Patient will be able to go for walk without increased pain during or afterwards to improve walking tolerance Baseline: Painful Goal status: INITIAL       PLAN:  PT FREQUENCY: 1x/week  PT DURATION: 12 weeks  PLANNED INTERVENTIONS: 97110-Therapeutic exercises, 97530- Therapeutic activity, 97112- Neuromuscular re-education, 97535- Self Care, [id number]- Manual therapy, [phone]- Gait training, [phone]- Orthotic Fit/training, [phone]- Canalith repositioning, [id number]- Aquatic Therapy, 97014- Electrical stimulation (unattended), [phone]- Ionotophoresis 4mg /ml Dexamethasone, Patient/Family education, Balance training, Stair training, Taping, Dry Needling, Joint mobilization, Joint manipulation, Spinal manipulation, Spinal mobilization, Cryotherapy, and Moist heat   PLAN FOR NEXT SESSION: lumbar ROM, decompression, Hip ROM, hip MMT, percussion gun   12:43 PM, 04/03/24 Tabitha Ewings, DPT Physical Therapy with [facility]

## 2024-04-10 ENCOUNTER — Ambulatory Visit: Admitting: Physical Therapy

## 2024-04-10 ENCOUNTER — Encounter: Payer: Self-pay | Admitting: Physical Therapy

## 2024-04-10 DIAGNOSIS — M25552 Pain in left hip: Secondary | ICD-10-CM

## 2024-04-10 DIAGNOSIS — M6281 Muscle weakness (generalized): Secondary | ICD-10-CM | POA: Diagnosis not present

## 2024-04-10 NOTE — Therapy (Signed)
 OUTPATIENT PHYSICAL THERAPY LOWER EXTREMITY TREATMENT   Patient Name: Trevor Romero. MRN: [id number] DOB:1946-09-07, 78 y.o., male Today's Date: 04/10/2024  END OF SESSION:  PT End of Session - 04/10/24 1431     Visit Number 4    Number of Visits 12    Date for PT Re-Evaluation 06/05/24    PT Start Time 1436    PT Stop Time 1514    PT Time Calculation (min) 38 min    Activity Tolerance Patient tolerated treatment well    Behavior During Therapy WFL for tasks assessed/performed              Past Medical History:  Diagnosis Date   Bladder neck obstruction    ED (erectile dysfunction)    HLD (hyperlipidemia)    Hydrocele    left   Hypertension    Urinary frequency    Past Surgical History:  Procedure Laterality Date   TONSILLECTOMY AND ADENOIDECTOMY     VASECTOMY     Patient Active Problem List   Diagnosis Date Noted   Elevated blood pressure reading 03/05/2019   SCC (squamous cell carcinoma) 11/29/2018   Hydrocele sac 12/12/2015   Benign prostatic hyperplasia with lower urinary tract symptoms 05/16/2015   Hyperlipidemia, mixed 12/07/2014    PCP: Rodney Clamp MD  REFERRING PROVIDER: Ulysees Gander, DO  REFERRING DIAG: [phone] (ICD-10-CM) - Primary osteoarthritis of left hip  THERAPY DIAG:  Pain in left hip  Muscle weakness (generalized)  Rationale for Evaluation and Treatment: Rehabilitation  ONSET DATE: Around christmas 2024  SUBJECTIVE:   SUBJECTIVE STATEMENT: 04/10/2024 States went for a walk today and walking faster and not as slumped as he used to.   EVAL: States he had an injection in his hip. States pain comes and goes and keeps him up at night. First they thought it was bursitis now they think it is arthritis. States that they have been doing stretching exercises. Mowing the grass and going for a walk aggravates his symptoms. Any time he has to walk he has to go slowly.        PERTINENT HISTORY: HTN,  PAIN:  Are you having  pain? Yes: NPRS scale: 1/10 Pain location: left hip lateral Pain description: awareness of hip  Aggravating factors: mowing, walking Relieving factors: rest, medication   PRECAUTIONS: None  RED FLAGS: None   WEIGHT BEARING RESTRICTIONS: No  FALLS:  Has patient fallen in last 6 months? No   OCCUPATION: not currently working.  PLOF: Independent  PATIENT GOALS: to have less pain    OBJECTIVE:  Note: Objective measures were completed at Evaluation unless otherwise noted.  DIAGNOSTIC FINDINGS: Right hip xray  02/06/24 FINDINGS: There is no evidence of hip fracture or dislocation of the left hip. No acute displaced fracture or dislocation of the right hip on frontal view. No acute displaced fracture or diastasis of the bones of the pelvis. Degenerative changes of the lower lumbar spine. There is no evidence of severe arthropathy or other focal bone abnormality.   IMPRESSION: Negative for acute traumatic injury.  PATIENT SURVEYS:  Patient-specific activity functional scoring scheme (Point to one number):  "0" represents "unable to perform." "10" represents "able to perform at prior level. 0 1 2 3 4 5 6 7 8 9  10 (Date and Score) Activity Initial  Activity Eval     Mowing the yard ( after)  5    Going for a walk (after) 5    Standing in one place  5    Additional Additional Total score = sum of the activity scores/number of activities Minimum detectable change (90%CI) for average score = 2 points Minimum detectable change (90%CI) for single activity score = 3 points PSFS developed by: [name], & [name], J. (1995). Assessing disability and change on individual  patients: a report of a patient specific measure. Physiotherapy [place], 47, [phone]. Reproduced with the permission of the authors  Score: 5   COGNITION: Overall cognitive status: Within functional limits for tasks assessed     SENSATION: Not tested  EDEMA:  none     POSTURE: rounded shoulders, forward head, posterior pelvic tilt, and flexed trunk   PALPATION: Tenderness to palpation and increased resting tone noted along left glutes and pirifor   lumbar AROM:    EVAL     Flexion  WFL     Extension  100% limited*     R ROT       L ROT       R SB  50% limited    L SB 50% limited *     * Pain   (Blank rows = not tested)   LE Measurements Lower Extremity Right EVAL Left EVAL   A/PROM MMT A/PROM MMT  Hip Flexion 105  105   Hip Extension  3+  3+  Hip Abduction      Hip Adduction      Hip Internal rotation 45  30   Hip External rotation 45  40   Knee Flexion      Knee Extension      Ankle Dorsiflexion      Ankle Plantarflexion      Ankle Inversion      Ankle Eversion       (Blank rows = not tested) * pain   LOWER EXTREMITY SPECIAL TESTS:  + scour B + Ely's B                                                                                                                                 TREATMENT DATE:   04/10/2024  Therapeutic Exercise:  Reviewed initial HEP  Supine: piriformis stretch IR/ER B 2 minutes Each, bent knee fall outs 2 minutes, LTR 2 minutes, bridges with post tilt 3x10   Prone:    Seated: goddess pose 3 minutes, hip IR 2x10 B  Standing: tall at wall x3 60" holds, marching with front arm support on wall for core activation 3x6 B slow and controlled Neuromuscular Re-education: Manual Therapy: STM with percussion gun to left hip - tolerated well  Therapeutic Activity: Self Care: Trigger Point Dry Needling:  Modalities:    PATIENT EDUCATION:  Education details: on HEP Person educated: Patient Education method: Explanation, Facilities manager, and Handouts Education comprehension: verbalized understanding   HOME EXERCISE PROGRAM: [id number]  ASSESSMENT:  CLINICAL IMPRESSION: 04/10/2024 Continued to progress exercises and patient tolerated all well. No pain just fatigue  noted end of session. Overall posture  and endurance improving. Patient will continue to benefit from skilled PT to improve overall  function and QOL.   EVAL: Patient presents to physical therapy with complaints of left hip pain that started last year around Christmas.  Patient presents with limitations in range of motion in hip and back as well as strength deficits that are likely contributing to current presentation.  Session focused on education and development of home exercise program.  Patient would greatly benefit from skilled PT to improve overall function and quality of life.  OBJECTIVE IMPAIRMENTS: Abnormal gait, decreased activity tolerance, improper body mechanics, postural dysfunction, and pain.   ACTIVITY LIMITATIONS: sitting, standing, sleeping, and locomotion level  PARTICIPATION LIMITATIONS: cleaning, community activity, and yard work  PERSONAL FACTORS: Age, Fitness, and Time since onset of injury/illness/exacerbation are also affecting patient's functional outcome.   REHAB POTENTIAL: Good  CLINICAL DECISION MAKING: Stable/uncomplicated  EVALUATION COMPLEXITY: Low   GOALS: Goals reviewed with patient? yes  SHORT TERM GOALS: Target date: 04/24/2024   Patient will be independent in self management strategies to improve quality of life and functional outcomes. Baseline: New Program Goal status: INITIAL  2.  Patient will report at least 50% improvement in overall symptoms and/or function to demonstrate improved functional mobility Baseline: 0% better Goal status: INITIAL  3.  Patient will demonstrate pain-free lumbar range of motion in all directions Baseline: Painful Goal status: INITIAL       LONG TERM GOALS: Target date: 06/05/2024    Patient will report at least 75% improvement in overall symptoms and/or function to demonstrate improved functional mobility Baseline: 0% better Goal status: INITIAL  2.  Patient will score at least 2 points higher on PSFS average to demonstrate change in overall  function. Baseline: see above Goal status: INITIAL  3.  Patient will be able to go for walk without increased pain during or afterwards to improve walking tolerance Baseline: Painful Goal status: INITIAL       PLAN:  PT FREQUENCY: 1x/week  PT DURATION: 12 weeks  PLANNED INTERVENTIONS: 97110-Therapeutic exercises, 97530- Therapeutic activity, 97112- Neuromuscular re-education, 97535- Self Care, 82956- Manual therapy, [phone]- Gait training, [phone]- Orthotic Fit/training, [phone]- Canalith repositioning, [id number]- Aquatic Therapy, 97014- Electrical stimulation (unattended), [phone]- Ionotophoresis 4mg /ml Dexamethasone, Patient/Family education, Balance training, Stair training, Taping, Dry Needling, Joint mobilization, Joint manipulation, Spinal manipulation, Spinal mobilization, Cryotherapy, and Moist heat   PLAN FOR NEXT SESSION: lumbar ROM, decompression, Hip ROM, hip MMT, percussion gun   3:15 PM, 04/10/24 Tabitha Ewings, DPT Physical Therapy with [facility]

## 2024-04-17 ENCOUNTER — Ambulatory Visit: Admitting: Physical Therapy

## 2024-04-17 ENCOUNTER — Encounter: Payer: Self-pay | Admitting: Family Medicine

## 2024-04-17 ENCOUNTER — Ambulatory Visit (INDEPENDENT_AMBULATORY_CARE_PROVIDER_SITE_OTHER): Payer: Medicare HMO | Admitting: Family Medicine

## 2024-04-17 ENCOUNTER — Encounter: Payer: Self-pay | Admitting: Physical Therapy

## 2024-04-17 VITALS — BP 154/79 | HR 58 | Temp 96.7°F | Ht 69.0 in | Wt 193.2 lb

## 2024-04-17 DIAGNOSIS — Z131 Encounter for screening for diabetes mellitus: Secondary | ICD-10-CM | POA: Diagnosis not present

## 2024-04-17 DIAGNOSIS — Z0001 Encounter for general adult medical examination with abnormal findings: Secondary | ICD-10-CM | POA: Diagnosis not present

## 2024-04-17 DIAGNOSIS — M6281 Muscle weakness (generalized): Secondary | ICD-10-CM | POA: Diagnosis not present

## 2024-04-17 DIAGNOSIS — R030 Elevated blood-pressure reading, without diagnosis of hypertension: Secondary | ICD-10-CM | POA: Diagnosis not present

## 2024-04-17 DIAGNOSIS — M25552 Pain in left hip: Secondary | ICD-10-CM | POA: Diagnosis not present

## 2024-04-17 DIAGNOSIS — R351 Nocturia: Secondary | ICD-10-CM

## 2024-04-17 DIAGNOSIS — E782 Mixed hyperlipidemia: Secondary | ICD-10-CM

## 2024-04-17 DIAGNOSIS — N401 Enlarged prostate with lower urinary tract symptoms: Secondary | ICD-10-CM | POA: Diagnosis not present

## 2024-04-17 LAB — COMPREHENSIVE METABOLIC PANEL WITH GFR
ALT: 15 U/L (ref 0–53)
AST: 22 U/L (ref 0–37)
Albumin: 4.2 g/dL (ref 3.5–5.2)
Alkaline Phosphatase: 66 U/L (ref 39–117)
BUN: 19 mg/dL (ref 6–23)
CO2: 30 meq/L (ref 19–32)
Calcium: 9.2 mg/dL (ref 8.4–10.5)
Chloride: 103 meq/L (ref 96–112)
Creatinine, Ser: 1.16 mg/dL (ref 0.40–1.50)
GFR: 59.99 mL/min — ABNORMAL LOW (ref >60.00)
Glucose, Bld: 78 mg/dL (ref 70–99)
Potassium: 3.8 meq/L (ref 3.5–5.1)
Sodium: 139 meq/L (ref 135–145)
Total Bilirubin: 0.6 mg/dL (ref 0.2–1.2)
Total Protein: 6.5 g/dL (ref 6.0–8.3)

## 2024-04-17 LAB — LIPID PANEL
Cholesterol: 146 mg/dL (ref 0–200)
HDL: 45.8 mg/dL (ref >39.00)
LDL Cholesterol: 75 mg/dL (ref 0–99)
NonHDL: 100.69
Total CHOL/HDL Ratio: 3
Triglycerides: 126 mg/dL (ref 0.0–149.0)
VLDL: 25.2 mg/dL (ref 0.0–40.0)

## 2024-04-17 LAB — HEMOGLOBIN A1C: Hgb A1c MFr Bld: 5.9 % (ref 4.6–6.5)

## 2024-04-17 LAB — CBC
HCT: 43.7 % (ref 39.0–52.0)
Hemoglobin: 14.6 g/dL (ref 13.0–17.0)
MCHC: 33.5 g/dL (ref 30.0–36.0)
MCV: 95.7 fl (ref 78.0–100.0)
Platelets: 233 10*3/uL (ref 150.0–400.0)
RBC: 4.56 Mil/uL (ref 4.22–5.81)
RDW: 13.3 % (ref 11.5–15.5)
WBC: 5.7 10*3/uL (ref 4.0–10.5)

## 2024-04-17 LAB — TSH: TSH: 5.23 u[IU]/mL (ref 0.35–5.50)

## 2024-04-17 LAB — PSA: PSA: 0.32 ng/mL (ref 0.10–4.00)

## 2024-04-17 NOTE — Patient Instructions (Signed)
 It was very nice to see you today!  We will check blood work today.  Please monitor your blood pressure and let us  know if persistently elevated.  Please continue to work on diet and exercise.  I will see you back in a year.  Please come back sooner if needed.  Return in about 1 year (around 04/17/2025) for Annual Physical.   Take care, Dr Daneil Dunker  PLEASE NOTE:  If you had any lab tests, please let us  know if you have not heard back within a few days. You may see your results on mychart before we have a chance to review them but we will give you a call once they are reviewed by us .   If we ordered any referrals today, please let us  know if you have not heard from their office within the next week.   If you had any urgent prescriptions sent in today, please check with the pharmacy within an hour of our visit to make sure the prescription was transmitted appropriately.   Please try these tips to maintain a healthy lifestyle:  Eat at least 3 REAL meals and 1-2 snacks per day.  Aim for no more than 5 hours between eating.  If you eat breakfast, please do so within one hour of getting up.   Each meal should contain half fruits/vegetables, one quarter protein, and one quarter carbs (no bigger than a computer mouse)  Cut down on sweet beverages. This includes juice, soda, and sweet tea.   Drink at least 1 glass of water with each meal and aim for at least 8 glasses per day  Exercise at least 150 minutes every week.    Preventive Care 76 Years and Older, Male Preventive care refers to lifestyle choices and visits with your health care provider that can promote health and wellness. Preventive care visits are also called wellness exams. What can I expect for my preventive care visit? Counseling During your preventive care visit, your health care provider may ask about your: Medical history, including: Past medical problems. Family medical history. History of falls. Current health,  including: Emotional well-being. Home life and relationship well-being. Sexual activity. Memory and ability to understand (cognition). Lifestyle, including: Alcohol, nicotine or tobacco, and drug use. Access to firearms. Diet, exercise, and sleep habits. Work and work Astronomer. Sunscreen use. Safety issues such as seatbelt and bike helmet use. Physical exam Your health care provider will check your: Height and weight. These may be used to calculate your BMI (body mass index). BMI is a measurement that tells if you are at a healthy weight. Waist circumference. This measures the distance around your waistline. This measurement also tells if you are at a healthy weight and may help predict your risk of certain diseases, such as type 2 diabetes and high blood pressure. Heart rate and blood pressure. Body temperature. Skin for abnormal spots. What immunizations do I need?  Vaccines are usually given at various ages, according to a schedule. Your health care provider will recommend vaccines for you based on your age, medical history, and lifestyle or other factors, such as travel or where you work. What tests do I need? Screening Your health care provider may recommend screening tests for certain conditions. This may include: Lipid and cholesterol levels. Diabetes screening. This is done by checking your blood sugar (glucose) after you have not eaten for a while (fasting). Hepatitis C test. Hepatitis B test. HIV (human immunodeficiency virus) test. STI (sexually transmitted infection) testing, if you  are at risk. Lung cancer screening. Colorectal cancer screening. Prostate cancer screening. Abdominal aortic aneurysm (AAA) screening. You may need this if you are a current or former smoker. Talk with your health care provider about your test results, treatment options, and if necessary, the need for more tests. Follow these instructions at home: Eating and drinking  Eat a diet that  includes fresh fruits and vegetables, whole grains, lean protein, and low-fat dairy products. Limit your intake of foods with high amounts of sugar, saturated fats, and salt. Take vitamin and mineral supplements as recommended by your health care provider. Do not drink alcohol if your health care provider tells you not to drink. If you drink alcohol: Limit how much you have to 0-2 drinks a day. Know how much alcohol is in your drink. In the [place] one drink equals one 12 oz bottle of beer (355 mL), one 5 oz glass of wine (148 mL), or one 1 oz glass of hard liquor (44 mL). Lifestyle Brush your teeth every morning and night with fluoride toothpaste. Floss one time each day. Exercise for at least 30 minutes 5 or more days each week. Do not use any products that contain nicotine or tobacco. These products include cigarettes, chewing tobacco, and vaping devices, such as e-cigarettes. If you need help quitting, ask your health care provider. Do not use drugs. If you are sexually active, practice safe sex. Use a condom or other form of protection to prevent STIs. Take aspirin only as told by your health care provider. Make sure that you understand how much to take and what form to take. Work with your health care provider to find out whether it is safe and beneficial for you to take aspirin daily. Ask your health care provider if you need to take a cholesterol-lowering medicine (statin). Find healthy ways to manage stress, such as: Meditation, yoga, or listening to music. Journaling. Talking to a trusted person. Spending time with friends and family. Safety Always wear your seat belt while driving or riding in a vehicle. Do not drive: If you have been drinking alcohol. Do not ride with someone who has been drinking. When you are tired or distracted. While texting. If you have been using any mind-altering substances or drugs. Wear a helmet and other protective equipment during sports  activities. If you have firearms in your house, make sure you follow all gun safety procedures. Minimize exposure to UV radiation to reduce your risk of skin cancer. What's next? Visit your health care provider once a year for an annual wellness visit. Ask your health care provider how often you should have your eyes and teeth checked. Stay up to date on all vaccines. This information is not intended to replace advice given to you by your health care provider. Make sure you discuss any questions you have with your health care provider. Document Revised: 05/04/2021 Document Reviewed: 05/04/2021 Elsevier Patient Education  2024 [employer].

## 2024-04-17 NOTE — Therapy (Signed)
 OUTPATIENT PHYSICAL THERAPY LOWER EXTREMITY TREATMENT PHYSICAL THERAPY DISCHARGE SUMMARY  Visits from Start of Care: 5  Current functional level related to goals / functional outcomes: All goals met   Remaining deficits: Strength and mobility   Education / Equipment: See below   Patient agrees to discharge. Patient goals were met. Patient is being discharged due to being pleased with the current functional level.   Patient Name: Trevor Romero. MRN: [id number] DOB:01-08-1946, 78 y.o., male Today's Date: 04/17/2024  END OF SESSION:  PT End of Session - 04/17/24 1003     Visit Number 5    Number of Visits 12    Date for PT Re-Evaluation 06/05/24    PT Start Time 1017    PT Stop Time 1055    PT Time Calculation (min) 38 min    Activity Tolerance Patient tolerated treatment well    Behavior During Therapy WFL for tasks assessed/performed              Past Medical History:  Diagnosis Date   Bladder neck obstruction    ED (erectile dysfunction)    HLD (hyperlipidemia)    Hydrocele    left   Hypertension    Urinary frequency    Past Surgical History:  Procedure Laterality Date   CATARACT EXTRACTION  2023   TONSILLECTOMY AND ADENOIDECTOMY     VASECTOMY     Patient Active Problem List   Diagnosis Date Noted   Elevated blood pressure reading 03/05/2019   SCC (squamous cell carcinoma) 11/29/2018   Hydrocele sac 12/12/2015   Benign prostatic hyperplasia with lower urinary tract symptoms 05/16/2015   Hyperlipidemia, mixed 12/07/2014    PCP: Rodney Clamp MD  REFERRING PROVIDER: Ulysees Gander, DO  REFERRING DIAG: [phone] (ICD-10-CM) - Primary osteoarthritis of left hip  THERAPY DIAG:  Pain in left hip  Muscle weakness (generalized)  Rationale for Evaluation and Treatment: Rehabilitation  ONSET DATE: Around christmas 2024  SUBJECTIVE:   SUBJECTIVE STATEMENT: 04/17/2024 States when he jerks his hip he still has occasional pain. States he is  walking better. Still feels stiff at times. Reports 85% better since start of PT.  EVAL: States he had an injection in his hip. States pain comes and goes and keeps him up at night. First they thought it was bursitis now they think it is arthritis. States that they have been doing stretching exercises. Mowing the grass and going for a walk aggravates his symptoms. Any time he has to walk he has to go slowly.        PERTINENT HISTORY: HTN,  PAIN:  Are you having pain? Yes: NPRS scale: 1/10 Pain location: left hip lateral Pain description: awareness of hip  Aggravating factors: mowing, walking Relieving factors: rest, medication   PRECAUTIONS: None  RED FLAGS: None   WEIGHT BEARING RESTRICTIONS: No  FALLS:  Has patient fallen in last 6 months? No   OCCUPATION: not currently working.  PLOF: Independent  PATIENT GOALS: to have less pain    OBJECTIVE:  Note: Objective measures were completed at Evaluation unless otherwise noted.  DIAGNOSTIC FINDINGS: Right hip xray  02/06/24 FINDINGS: There is no evidence of hip fracture or dislocation of the left hip. No acute displaced fracture or dislocation of the right hip on frontal view. No acute displaced fracture or diastasis of the bones of the pelvis. Degenerative changes of the lower lumbar spine. There is no evidence of severe arthropathy or other focal bone abnormality.   IMPRESSION: Negative  for acute traumatic injury.  PATIENT SURVEYS:  Patient-specific activity functional scoring scheme (Point to one number):  "0" represents "unable to perform." "10" represents "able to perform at prior level. 0 1 2 3 4 5 6 7 8 9  10 (Date and Score) Activity Initial  Activity Eval  5/29   Mowing the yard ( after)  5  9  Going for a walk (after) 5  9  Standing in one place  5 7   Additional Additional Total score = sum of the activity scores/number of activities Minimum detectable change (90%CI) for average score = 2  points Minimum detectable change (90%CI) for single activity score = 3 points PSFS developed by: [name], & [name], J. (1995). Assessing disability and change on individual  patients: a report of a patient specific measure. Physiotherapy [place], [phone], [phone]. Reproduced with the permission of the authors  Score: EVAL: 5, 5/29 25/3=8.33   COGNITION: Overall cognitive status: Within functional limits for tasks assessed     SENSATION: Not tested  EDEMA:  none    POSTURE: rounded shoulders, forward head, posterior pelvic tilt, and flexed trunk   PALPATION: Tenderness to palpation and increased resting tone noted along left glutes and pirifor   lumbar AROM:    EVAL 5/29    Flexion  [facility]  WFL   Extension  100% limited*  100% limited^   R ROT       L ROT       R SB  50% limited  50% limited   L SB 50% limited * 50 %limited    * Pain     ^stiffness (Blank rows = not tested)   LE Measurements Lower Extremity Right 5/29 Left 5/29   A/PROM MMT A/PROM MMT  Hip Flexion 105 4 105 4  Hip Extension  4-  4-  Hip Abduction      Hip Adduction      Hip Internal rotation 30*  30*   Hip External rotation 45  40   Knee Flexion      Knee Extension      Ankle Dorsiflexion      Ankle Plantarflexion      Ankle Inversion      Ankle Eversion       (Blank rows = not tested) * pain   LOWER EXTREMITY SPECIAL TESTS:  + scour B + Ely's B                                                                                                                                 TREATMENT DATE:   04/17/2024  Therapeutic Exercise:  Reviewed initial HEP  Objective measures updated Supine:   Prone:    Seated: STS with 5# weight 3x10, goddess pose 2 minutes 5" holds B  Standing:  Neuromuscular Re-education: tandem x3 30" holds, narrow BOS turns of body 2 minutes, SLS x3 30" holds B no  UE support Manual Therapy:   Therapeutic Activity: Self Care: Trigger Point Dry  Needling:  Modalities:    PATIENT EDUCATION:  Education details: on HEP, on progress made and plan moving forward  Person educated: Patient Education method: Explanation, Demonstration, and Handouts Education comprehension: verbalized understanding   HOME EXERCISE PROGRAM: [id number] Goddess pose   ASSESSMENT:  CLINICAL IMPRESSION: 04/17/2024 All goals met at this time. Reviewed HEP and discussed frequency of exercises as well as anticipated progress moving forward. Patient to discharge from PT to HEP secondary to progress made while in PT. Answered all questions prior to discharge.   EVAL: Patient presents to physical therapy with complaints of left hip pain that started last year around Christmas.  Patient presents with limitations in range of motion in hip and back as well as strength deficits that are likely contributing to current presentation.  Session focused on education and development of home exercise program.  Patient would greatly benefit from skilled PT to improve overall function and quality of life.  OBJECTIVE IMPAIRMENTS: Abnormal gait, decreased activity tolerance, improper body mechanics, postural dysfunction, and pain.   ACTIVITY LIMITATIONS: sitting, standing, sleeping, and locomotion level  PARTICIPATION LIMITATIONS: cleaning, community activity, and yard work  PERSONAL FACTORS: Age, Fitness, and Time since onset of injury/illness/exacerbation are also affecting patient's functional outcome.   REHAB POTENTIAL: Good  CLINICAL DECISION MAKING: Stable/uncomplicated  EVALUATION COMPLEXITY: Low   GOALS: Goals reviewed with patient? yes  SHORT TERM GOALS: Target date: 04/24/2024   Patient will be independent in self management strategies to improve quality of life and functional outcomes. Baseline: New Program Goal status: MET   2.  Patient will report at least 50% improvement in overall symptoms and/or function to demonstrate improved functional  mobility Baseline: 0% better Goal status: MET  3.  Patient will demonstrate pain-free lumbar range of motion in all directions Baseline: Painful Goal status: MET       LONG TERM GOALS: Target date: 06/05/2024    Patient will report at least 75% improvement in overall symptoms and/or function to demonstrate improved functional mobility Baseline: 0% better Goal status: MET  2.  Patient will score at least 2 points higher on PSFS average to demonstrate change in overall function. Baseline: see above Goal status: MET  3.  Patient will be able to go for walk without increased pain during or afterwards to improve walking tolerance Baseline: Painful Goal status: MET       PLAN:  PT FREQUENCY: 1x/week  PT DURATION: 12 weeks  PLANNED INTERVENTIONS: 97110-Therapeutic exercises, 97530- Therapeutic activity, 97112- Neuromuscular re-education, 97535- Self Care, 44010- Manual therapy, [phone]- Gait training, [phone]- Orthotic Fit/training, [phone]- Canalith repositioning, [id number]- Aquatic Therapy, 97014- Electrical stimulation (unattended), [phone]- Ionotophoresis 4mg /ml Dexamethasone, Patient/Family education, Balance training, Stair training, Taping, Dry Needling, Joint mobilization, Joint manipulation, Spinal manipulation, Spinal mobilization, Cryotherapy, and Moist heat   PLAN FOR NEXT SESSION: DC to HEP    10:55 AM, 04/17/24 Tabitha Ewings, DPT Physical Therapy with [facility]

## 2024-04-17 NOTE — Progress Notes (Signed)
 Chief Complaint:  Trevor Romero. is a 78 y.o. male who presents today for his annual comprehensive physical exam.    Assessment/Plan:  Chronic Problems Addressed Today: Elevated blood pressure reading Elevated today.  At goal at recent visit with sports medicine.  Typically well-controlled at home.  Not currently on meds.  He will continue to monitor at home and let us  know if persistently elevated.  Hyperlipidemia, mixed Doing well with Lipitor 10 mg daily.  Check labs.  Benign prostatic hyperplasia with lower urinary tract symptoms Stable on finasteride  5 mg daily.  Check PSA.  Preventative Healthcare: Check labs.  Up-to-date on vaccines.  No longer needs colon cancer screening due to age.  Patient Counseling(The following topics were reviewed and/or handout was given):  -Nutrition: Stressed importance of moderation in sodium/caffeine intake, saturated fat and cholesterol, caloric balance, sufficient intake of fresh fruits, vegetables, and fiber.  -Stressed the importance of regular exercise.   -Substance Abuse: Discussed cessation/primary prevention of tobacco, alcohol, or other drug use; driving or other dangerous activities under the influence; availability of treatment for abuse.   -Injury prevention: Discussed safety belts, safety helmets, smoke detector, smoking near bedding or upholstery.   -Sexuality: Discussed sexually transmitted diseases, partner selection, use of condoms, avoidance of unintended pregnancy and contraceptive alternatives.   -Dental health: Discussed importance of regular tooth brushing, flossing, and dental visits.  -Health maintenance and immunizations reviewed. Please refer to Health maintenance section.  Return to care in 1 year for next preventative visit.     Subjective:  HPI:  He has no acute complaints today. See Assessment / plan for status of chronic conditions. Patient here today for annual physical.   Lifestyle Diet: Balanced.  Plenty of fruits and vegetables.  Exercise: Goes to gym routinely.      04/17/2024    9:21 AM  Depression screen PHQ 2/9  Decreased Interest 0  Down, Depressed, Hopeless 0  PHQ - 2 Score 0  Altered sleeping 0  Tired, decreased energy 0  Change in appetite 0  Feeling bad or failure about yourself  0  Trouble concentrating 0  Moving slowly or fidgety/restless 0  Suicidal thoughts 0  PHQ-9 Score 0  Difficult doing work/chores Not difficult at all   ROS: Per HPI, otherwise a complete review of systems was negative.   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   Hydrocele sac 12/12/2015   Benign prostatic hyperplasia with lower urinary tract symptoms 05/16/2015   Hyperlipidemia, mixed 12/07/2014   Past Surgical History:  Procedure Laterality Date   CATARACT EXTRACTION  2023   TONSILLECTOMY AND ADENOIDECTOMY     VASECTOMY      Family History  Problem Relation Age of Onset   Mental illness Mother    COPD Father    Colon polyps Father    Stroke Maternal Grandfather    Cancer Neg Hx        Kidney,Prostate, Bladder    Medications- reviewed and updated Current Outpatient Medications  Medication Sig Dispense Refill   atorvastatin  (LIPITOR) 10 MG tablet Take 1 tablet by mouth once  daily 90 tablet 0   finasteride  (PROSCAR ) 5 MG tablet Take 1 tablet (5 mg total) by mouth daily. 90 tablet 3   meloxicam  (MOBIC ) 15 MG tablet Take 1 tablet (15 mg total) by mouth daily. 30 tablet 0   Multiple Vitamin (MULTIVITAMIN) tablet Take 1 tablet by mouth daily.     No current facility-administered medications for this visit.    Allergies-reviewed  and updated No Known Allergies  Social History   Socioeconomic History   Marital status: Married    Spouse name: Not on file   Number of children: 4   Years of education: Not on file   Highest education level: Bachelor's degree (e.g., BA, AB, BS)  Occupational History   Occupation: retired  Tobacco Use   Smoking status: Never   Smokeless tobacco: Never  Vaping Use   Vaping status: Never Used  Substance and Sexual Activity   Alcohol use: Yes    Alcohol/week: 2.0 standard drinks of alcohol    Types: 2 Glasses of wine per week    Comment: occasionally    Drug use: No   Sexual activity: Not Currently  Other Topics Concern   Not on file  Social History Narrative   Not on file   Social Drivers of Health   Financial Resource Strain: Low Risk  (02/26/2024)   Overall Financial Resource Strain (CARDIA)    Difficulty of Paying Living Expenses: Not hard at all  Food Insecurity: No Food Insecurity (02/26/2024)   Hunger Vital Sign    Worried About Running Out of Food in the Last Year: Never true    Ran Out of Food in the Last Year: Never true  Transportation Needs: No Transportation Needs (02/26/2024)   PRAPARE - Administrator, Civil Service (Medical): No    Lack of Transportation (Non-Medical): No  Physical Activity: Insufficiently Active (02/26/2024)   Exercise Vital Sign    Days of Exercise per Week: 3 days    Minutes of Exercise per Session: 30 min  Stress: No Stress Concern Present (02/26/2024)   [employer] of Occupational Health - Occupational Stress Questionnaire    Feeling of Stress : Not at all  Social Connections: Moderately Integrated (02/26/2024)   Social Connection and Isolation Panel [NHANES]    Frequency of Communication with Friends and Family: More than three times a week    Frequency of Social Gatherings with Friends and Family: More than three times a week    Attends Religious Services: More than 4 times per year    Active Member of [employer] or  Organizations: No    Attends Engineer, structural: Never    Marital Status: Married        Objective:  Physical Exam: BP (!) 154/79   Pulse (!) 58   Temp (!) 96.7 F (35.9 C) (Temporal)   Ht 5\' 9"  (1.753 m)   Wt 193 lb 3.2 oz (87.6 kg)   SpO2 100%   BMI 28.53 kg/m   Body mass index is 28.53 kg/m. Wt Readings from Last 3 Encounters:  04/17/24 193 lb 3.2 oz (87.6 kg)  02/27/24 191 lb (86.6 kg)  02/26/24 191 lb 3.2 oz (86.7 kg)   Gen: NAD, resting comfortably HEENT: TMs normal bilaterally. OP clear. No thyromegaly noted.  CV: RRR with no murmurs appreciated Pulm: NWOB, CTAB with no crackles, wheezes, or rhonchi GI: Normal bowel sounds present. Soft, Nontender, Nondistended. MSK: no edema, cyanosis, or clubbing noted Skin: warm, dry Neuro: CN2-12 grossly  intact. Strength 5/5 in upper and lower extremities. Reflexes symmetric and intact bilaterally.  Psych: Normal affect and thought content     Miliana Gangwer M. Daneil Dunker, MD 04/17/2024 9:47 AM

## 2024-04-17 NOTE — Assessment & Plan Note (Signed)
 Elevated today.  At goal at recent visit with sports medicine.  Typically well-controlled at home.  Not currently on meds.  He will continue to monitor at home and let us  know if persistently elevated.

## 2024-04-17 NOTE — Assessment & Plan Note (Signed)
 Doing well with Lipitor 10 mg daily.  Check labs.

## 2024-04-17 NOTE — Assessment & Plan Note (Signed)
 Stable on finasteride  5 mg daily.  Check PSA.

## 2024-04-18 ENCOUNTER — Ambulatory Visit: Payer: Self-pay | Admitting: Family Medicine

## 2024-04-18 NOTE — Progress Notes (Signed)
 Labs are all stable.  Do not need to make any changes to treatment plan at this time.  He should continue to work on diet and exercise and we can recheck again in a year.

## 2024-04-21 NOTE — Progress Notes (Signed)
    Ben Harvest Stanco D.[facility] [address] [place] [address] Phone: [phone]   Assessment and Plan:     1. Primary osteoarthritis of left hip 2. Left hip pain  -Chronic with exacerbation, subsequent visit - Continued overall significant improvement in flare of left hip pain consistent with resolved flare of left hip osteoarthritis - Continue HEP.  Patient completed physical therapy -Continue Tylenol 500 to 1000 mg tablets 2-3 times a day for day-to-day pain relief  -Continue meloxicam  15 mg daily as needed for breakthrough pain.  Recommend limiting chronic NSAIDs to 1-2 doses per week  Pertinent previous records reviewed include none  Follow Up: As needed.  Could consider repeat intra-articular CSI as patient has had significant benefit since injection 02/06/2024   Subjective:   I, Trevor Romero, am serving as a Neurosurgeon for Doctor Ulysees Gander   Chief Complaint: left hip pain    HPI:    02/06/2024 Patient is a 78 year old male with left hip pain. Patient states pain started before christmas. Pain when standing, laying in bed. He is active but the pain limits his action. Was taking meloxicam  and cocktail only lasted for a week. Pain radiates to his thigh and knee. No numbness or tingling. No MOI. Decreased ROM. Pain is intermittent on severity. Ice helps a little.    02/27/2024 Patient states he still has intermittent pain. Feels he is a little bit better.   04/22/2024 Patient states he is a lot better. PT has helped. Still has some soreness   Relevant Historical Information: None pertinent  Additional pertinent review of systems negative.   Current Outpatient Medications:    atorvastatin  (LIPITOR) 10 MG tablet, Take 1 tablet by mouth once daily, Disp: 90 tablet, Rfl: 0   finasteride  (PROSCAR ) 5 MG tablet, Take 1 tablet (5 mg total) by mouth daily., Disp: 90 tablet, Rfl: 3   meloxicam  (MOBIC ) 15 MG tablet, Take 1 tablet (15 mg  total) by mouth daily., Disp: 30 tablet, Rfl: 0   Multiple Vitamin (MULTIVITAMIN) tablet, Take 1 tablet by mouth daily., Disp: , Rfl:    Objective:     Vitals:   04/22/24 1351  BP: 138/88  Pulse: 79  SpO2: 97%  Weight: 193 lb (87.5 kg)  Height: 5\' 9"  (1.753 m)      Body mass index is 28.5 kg/m.    Physical Exam:    General: awake, alert, and oriented no acute distress, nontoxic Skin: no suspicious lesions or rashes Neuro:sensation intact distally with no deficits, normal muscle tone, no atrophy, strength 5/5 in all tested lower ext groups Psych: normal mood and affect, speech clear   Left hip: No deformity, swelling or wasting ROM Flexion 90, ext 25, IR 30, ER 40 NTTP over the hip flexors, greater trochanter, gluteal musculature, si joint, lumbar spine Negative log roll with FROM Positive FABER for mild anterior hip pain Positive FADIR for mild anterior hip pain Negative Piriformis test Negative trendelenberg Gait normal     Electronically signed by:  Marshall Skeeter D.[facility] 2:01 PM 04/22/24

## 2024-04-22 ENCOUNTER — Ambulatory Visit: Admitting: Sports Medicine

## 2024-04-22 VITALS — BP 138/88 | HR 79 | Ht 69.0 in | Wt 193.0 lb

## 2024-04-22 DIAGNOSIS — M25552 Pain in left hip: Secondary | ICD-10-CM

## 2024-04-22 DIAGNOSIS — M1612 Unilateral primary osteoarthritis, left hip: Secondary | ICD-10-CM | POA: Diagnosis not present

## 2024-04-22 NOTE — Patient Instructions (Signed)
 Tylenol [phone] mg 2-3 times a day for pain relief  Meloxicam  or NSAID as needed for breakthrough pain limit 1-2 times per week  Continue HEP  As needed follow up

## 2024-04-24 ENCOUNTER — Encounter: Admitting: Physical Therapy

## 2024-04-26 ENCOUNTER — Other Ambulatory Visit: Payer: Self-pay | Admitting: Family Medicine

## 2024-04-26 DIAGNOSIS — N3941 Urge incontinence: Secondary | ICD-10-CM

## 2024-05-01 ENCOUNTER — Encounter: Admitting: Physical Therapy

## 2024-05-07 ENCOUNTER — Other Ambulatory Visit: Payer: Self-pay | Admitting: Family Medicine

## 2024-07-21 ENCOUNTER — Other Ambulatory Visit: Payer: Self-pay | Admitting: Family Medicine

## 2024-07-21 DIAGNOSIS — N3941 Urge incontinence: Secondary | ICD-10-CM

## 2024-08-08 ENCOUNTER — Other Ambulatory Visit: Payer: Self-pay | Admitting: Family Medicine

## 2024-09-11 DIAGNOSIS — H43813 Vitreous degeneration, bilateral: Secondary | ICD-10-CM | POA: Diagnosis not present

## 2024-09-11 DIAGNOSIS — H25012 Cortical age-related cataract, left eye: Secondary | ICD-10-CM | POA: Diagnosis not present

## 2024-09-11 DIAGNOSIS — Z961 Presence of intraocular lens: Secondary | ICD-10-CM | POA: Diagnosis not present

## 2024-09-11 DIAGNOSIS — H5202 Hypermetropia, left eye: Secondary | ICD-10-CM | POA: Diagnosis not present

## 2024-09-11 DIAGNOSIS — H524 Presbyopia: Secondary | ICD-10-CM | POA: Diagnosis not present

## 2024-09-11 DIAGNOSIS — H2512 Age-related nuclear cataract, left eye: Secondary | ICD-10-CM | POA: Diagnosis not present

## 2024-10-18 ENCOUNTER — Other Ambulatory Visit: Payer: Self-pay | Admitting: Family Medicine

## 2024-10-18 DIAGNOSIS — N3941 Urge incontinence: Secondary | ICD-10-CM

## 2024-10-29 DIAGNOSIS — Z0120 Encounter for dental examination and cleaning without abnormal findings: Secondary | ICD-10-CM | POA: Diagnosis not present

## 2024-11-08 ENCOUNTER — Other Ambulatory Visit: Payer: Self-pay | Admitting: Family Medicine

## 2025-03-03 ENCOUNTER — Ambulatory Visit

## 2025-04-21 ENCOUNTER — Encounter: Admitting: Family Medicine
